# Patient Record
Sex: Female | Born: 1985 | Race: White | Hispanic: No | Marital: Married | Smoking: Never smoker
Health system: Southern US, Community
[De-identification: ages and names within clinical notes are randomized; demographics above are authoritative.]

## PROBLEM LIST (undated history)

## (undated) DIAGNOSIS — F32A Depression, unspecified: Secondary | ICD-10-CM

## (undated) DIAGNOSIS — T8859XA Other complications of anesthesia, initial encounter: Secondary | ICD-10-CM

## (undated) DIAGNOSIS — R112 Nausea with vomiting, unspecified: Secondary | ICD-10-CM

## (undated) DIAGNOSIS — R51 Headache: Secondary | ICD-10-CM

## (undated) DIAGNOSIS — Z9889 Other specified postprocedural states: Secondary | ICD-10-CM

## (undated) DIAGNOSIS — J189 Pneumonia, unspecified organism: Secondary | ICD-10-CM

## (undated) DIAGNOSIS — K219 Gastro-esophageal reflux disease without esophagitis: Secondary | ICD-10-CM

## (undated) DIAGNOSIS — T4145XA Adverse effect of unspecified anesthetic, initial encounter: Secondary | ICD-10-CM

## (undated) DIAGNOSIS — Z87442 Personal history of urinary calculi: Secondary | ICD-10-CM

## (undated) DIAGNOSIS — R519 Headache, unspecified: Secondary | ICD-10-CM

## (undated) DIAGNOSIS — F329 Major depressive disorder, single episode, unspecified: Secondary | ICD-10-CM

## (undated) DIAGNOSIS — M1990 Unspecified osteoarthritis, unspecified site: Secondary | ICD-10-CM

---

## 2005-03-13 HISTORY — PX: OTHER SURGICAL HISTORY: SHX169

## 2005-03-13 HISTORY — PX: APPENDECTOMY: SHX54

## 2010-03-13 HISTORY — PX: LAPAROSCOPIC GASTRIC BANDING: SHX1100

## 2012-03-13 HISTORY — PX: CHOLECYSTECTOMY: SHX55

## 2016-08-04 ENCOUNTER — Ambulatory Visit
Admission: RE | Admit: 2016-08-04 | Discharge: 2016-08-04 | Disposition: A | Discharge status: Home / Self Care | Payer: BLUE CROSS/BLUE SHIELD | Source: Ambulatory Visit | Attending: Surgery | Admitting: Surgery

## 2016-08-04 ENCOUNTER — Other Ambulatory Visit: Payer: Self-pay | Admitting: Surgery

## 2016-08-04 DIAGNOSIS — Z9884 Bariatric surgery status: Secondary | ICD-10-CM

## 2016-09-08 ENCOUNTER — Ambulatory Visit
Admission: RE | Admit: 2016-09-08 | Discharge: 2016-09-08 | Disposition: A | Discharge status: Home / Self Care | Payer: BLUE CROSS/BLUE SHIELD | Source: Ambulatory Visit | Attending: Surgery | Admitting: Surgery

## 2016-09-08 ENCOUNTER — Other Ambulatory Visit: Payer: Self-pay | Admitting: Surgery

## 2016-09-08 DIAGNOSIS — Z9884 Bariatric surgery status: Secondary | ICD-10-CM

## 2016-09-25 ENCOUNTER — Other Ambulatory Visit (HOSPITAL_COMMUNITY): Payer: Self-pay | Admitting: Surgery

## 2016-09-25 DIAGNOSIS — Z9884 Bariatric surgery status: Secondary | ICD-10-CM

## 2016-09-26 ENCOUNTER — Other Ambulatory Visit: Payer: Self-pay | Admitting: Surgery

## 2016-09-26 DIAGNOSIS — Z9884 Bariatric surgery status: Secondary | ICD-10-CM

## 2016-10-02 ENCOUNTER — Other Ambulatory Visit: Payer: Self-pay | Admitting: Surgery

## 2016-10-02 ENCOUNTER — Ambulatory Visit
Admission: RE | Admit: 2016-10-02 | Discharge: 2016-10-02 | Disposition: A | Discharge status: Home / Self Care | Payer: BLUE CROSS/BLUE SHIELD | Source: Ambulatory Visit | Attending: Surgery | Admitting: Surgery

## 2016-10-02 DIAGNOSIS — Z9884 Bariatric surgery status: Secondary | ICD-10-CM

## 2016-10-25 NOTE — Progress Notes (Signed)
Please place orders in EPIC as patient is being scheduled for a pre-op appointment! Thank you! 

## 2016-10-27 ENCOUNTER — Other Ambulatory Visit: Payer: Self-pay | Admitting: Surgery

## 2016-11-06 ENCOUNTER — Encounter: Payer: BLUE CROSS/BLUE SHIELD | Attending: Surgery | Admitting: Skilled Nursing Facility1

## 2016-11-06 DIAGNOSIS — E669 Obesity, unspecified: Secondary | ICD-10-CM | POA: Diagnosis present

## 2016-11-06 DIAGNOSIS — Z713 Dietary counseling and surveillance: Secondary | ICD-10-CM | POA: Insufficient documentation

## 2016-11-08 NOTE — Progress Notes (Signed)
Pre-Operative Nutrition Class:  Appt start time: 9629   End time:  1830.  Patient was seen on 11/06/2016 for Pre-Operative Bariatric Surgery Education at [facility].   Pt arrived too late to the class to get any information from her including height, weight, assessments/screenings. Surgery date: 11/21/2016 Surgery type: Sleeve Start weight at [facility]:  Weight today:   Samples given per MNT protocol. Patient educated on appropriate usage: Bariatric Advantage Multivitamin Lot # [id number] Exp: 06/19  Bariatric Advantage Calcium  Lot # [id number] Exp: dec-19-2018  PremierProtein Shake Lot # [id number] Exp: 27/nov/2018 The following the learning objectives were met by the patient during this course:  Identify Pre-Op Dietary Goals and will begin 2 weeks pre-operatively  Identify appropriate sources of fluids and proteins   State protein recommendations and appropriate sources pre and post-operatively  Identify Post-Operative Dietary Goals and will follow for 2 weeks post-operatively  Identify appropriate multivitamin and calcium sources  Describe the need for physical activity post-operatively and will follow MD recommendations  State when to call healthcare provider regarding medication questions or post-operative complications  Handouts given during class include:  Pre-Op Bariatric Surgery Diet Handout  Protein Shake Handout  Post-Op Bariatric Surgery Nutrition Handout  BELT Program Information Flyer  Support Group Information Flyer  [facility] Bariatric Supplements Price List  Follow-Up Plan: Patient will follow-up at [facility] 2 weeks post operatively for diet advancement per MD.

## 2016-11-10 NOTE — Patient Instructions (Addendum)
Jamie Cooper  11/10/2016   Your procedure is scheduled on: Tuesday 11-21-16  Report to [facility]  Entrance [place]  elevators to 3rd floor to  Short Stay Center at 515 AM.   Call this number if you have problems the morning of surgery [phone]  Remember: ONLY 1 PERSON MAY GO WITH YOU TO SHORT STAY TO GET  READY MORNING OF YOUR SURGERY.  Do not eat food or drink liquids :After Midnight.     Take these medicines the morning of surgery with A SIP OF WATER:none              You may not have any metal on your body including hair pins and              piercings  Do not wear jewelry, make-up, lotions, powders or perfumes, deodorant             Do not wear nail polish.  Do not shave  48 hours prior to surgery.              Men may shave face and neck.   Do not bring valuables to the hospital. [name] IS NOT             RESPONSIBLE   FOR VALUABLES.  Contacts, dentures or bridgework may not be worn into surgery.  Leave suitcase in the car. After surgery it may be brought to your room.                 Please read over the following fact sheets you were given: _____________________________________________________________________             [name] - Preparing for Surgery Before surgery, you can play an important role.  Because skin is not sterile, your skin needs to be as free of germs as possible.  You can reduce the number of germs on your skin by washing with CHG (chlorahexidine gluconate) soap before surgery.  CHG is an antiseptic cleaner which kills germs and bonds with the skin to continue killing germs even after washing. Please DO NOT use if you have an allergy to CHG or antibacterial soaps.  If your skin becomes reddened/irritated stop using the CHG and inform your nurse when you arrive at Short Stay. Do not shave (including legs and underarms) for at least 48 hours prior to the first CHG shower.  You may shave your face/neck. Please follow  these instructions carefully:  1.  Shower with CHG Soap the night before surgery and the  morning of Surgery.  2.  If you choose to wash your hair, wash your hair first as usual with your  normal  shampoo.  3.  After you shampoo, rinse your hair and body thoroughly to remove the  shampoo.                           4.  Use CHG as you would any other liquid soap.  You can apply chg directly  to the skin and wash                       Gently with a scrungie or clean washcloth.  5.  Apply the CHG Soap to your body ONLY FROM THE NECK DOWN.   Do not use on face/ open  Wound or open sores. Avoid contact with eyes, ears mouth and genitals (private parts).                       Wash face,  Genitals (private parts) with your normal soap.             6.  Wash thoroughly, paying special attention to the area where your surgery  will be performed.  7.  Thoroughly rinse your body with warm water from the neck down.  8.  DO NOT shower/wash with your normal soap after using and rinsing off  the CHG Soap.                9.  Pat yourself dry with a clean towel.            10.  Wear clean pajamas.            11.  Place clean sheets on your bed the night of your first shower and do not  sleep with pets. Day of Surgery : Do not apply any lotions/deodorants the morning of surgery.  Please wear clean clothes to the hospital/surgery center.  FAILURE TO FOLLOW THESE INSTRUCTIONS MAY RESULT IN THE CANCELLATION OF YOUR SURGERY PATIENT SIGNATURE_________________________________  NURSE SIGNATURE__________________________________  ________________________________________________________________________

## 2016-11-14 ENCOUNTER — Encounter (HOSPITAL_COMMUNITY)
Admission: RE | Admit: 2016-11-14 | Discharge: 2016-11-14 | Disposition: A | Discharge status: Home / Self Care | Payer: BLUE CROSS/BLUE SHIELD | Source: Ambulatory Visit | Attending: Surgery | Admitting: Surgery

## 2016-11-14 ENCOUNTER — Encounter (HOSPITAL_COMMUNITY): Payer: Self-pay

## 2016-11-14 DIAGNOSIS — Z9884 Bariatric surgery status: Secondary | ICD-10-CM | POA: Diagnosis not present

## 2016-11-14 DIAGNOSIS — Z01818 Encounter for other preprocedural examination: Secondary | ICD-10-CM | POA: Diagnosis present

## 2016-11-14 HISTORY — DX: Headache: R51

## 2016-11-14 HISTORY — DX: Major depressive disorder, single episode, unspecified: F32.9

## 2016-11-14 HISTORY — DX: Other specified postprocedural states: Z98.890

## 2016-11-14 HISTORY — DX: Personal history of urinary calculi: Z87.442

## 2016-11-14 HISTORY — DX: Headache, unspecified: R51.9

## 2016-11-14 HISTORY — DX: Other specified postprocedural states: R11.2

## 2016-11-14 HISTORY — DX: Gastro-esophageal reflux disease without esophagitis: K21.9

## 2016-11-14 HISTORY — DX: Other complications of anesthesia, initial encounter: T88.59XA

## 2016-11-14 HISTORY — DX: Depression, unspecified: F32.A

## 2016-11-14 HISTORY — DX: Adverse effect of unspecified anesthetic, initial encounter: T41.45XA

## 2016-11-14 HISTORY — DX: Pneumonia, unspecified organism: J18.9

## 2016-11-14 HISTORY — DX: Unspecified osteoarthritis, unspecified site: M19.90

## 2016-11-14 LAB — CBC WITH DIFFERENTIAL/PLATELET
BASOS ABS: 0 10*3/uL (ref 0.0–0.1)
BASOS PCT: 1 %
Eosinophils Absolute: 0.2 10*3/uL (ref 0.0–0.7)
Eosinophils Relative: 3 %
HEMATOCRIT: 37.8 % (ref 36.0–46.0)
HEMOGLOBIN: 12 g/dL (ref 12.0–15.0)
Lymphocytes Relative: 34 %
Lymphs Abs: 2.1 10*3/uL (ref 0.7–4.0)
MCH: 24.6 pg — ABNORMAL LOW (ref 26.0–34.0)
MCHC: 31.7 g/dL (ref 30.0–36.0)
MCV: 77.5 fL — ABNORMAL LOW (ref 78.0–100.0)
MONOS PCT: 6 %
Monocytes Absolute: 0.4 10*3/uL (ref 0.1–1.0)
NEUTROS ABS: 3.6 10*3/uL (ref 1.7–7.7)
NEUTROS PCT: 56 %
Platelets: 237 10*3/uL (ref 150–400)
RBC: 4.88 MIL/uL (ref 3.87–5.11)
RDW: 15.4 % (ref 11.5–15.5)
WBC: 6.4 10*3/uL (ref 4.0–10.5)

## 2016-11-14 LAB — COMPREHENSIVE METABOLIC PANEL
ALBUMIN: 3.9 g/dL (ref 3.5–5.0)
ALT: 13 U/L — ABNORMAL LOW (ref 14–54)
AST: 16 U/L (ref 15–41)
Alkaline Phosphatase: 49 U/L (ref 38–126)
Anion gap: 7 (ref 5–15)
BILIRUBIN TOTAL: 0.4 mg/dL (ref 0.3–1.2)
BUN: 12 mg/dL (ref 6–20)
CALCIUM: 8.9 mg/dL (ref 8.9–10.3)
CO2: 20 mmol/L — ABNORMAL LOW (ref 22–32)
Chloride: 111 mmol/L (ref 101–111)
Creatinine, Ser: 0.76 mg/dL (ref 0.44–1.00)
GFR calc Af Amer: >60 mL/min (ref >60)
GFR calc non Af Amer: >60 mL/min (ref >60)
GLUCOSE: 85 mg/dL (ref 65–99)
Potassium: 4.1 mmol/L (ref 3.5–5.1)
Sodium: 138 mmol/L (ref 135–145)
TOTAL PROTEIN: 6.9 g/dL (ref 6.5–8.1)

## 2016-11-20 NOTE — H&P (Signed)
Jamie Cooper  Location[facility] Patient #: [id number] DOB: 1985-12-19 Married / Language: English / Race: White Female  History of Present Illness   The patient is a 31 year old female who presents with a complaint of lap band management.  The PCP is Dr. Gilda CreaseMahendra Narendran [facility]  She comes with her daughter, Jamie Cooper.  She has a slipped lap band. We are plannig a conversion to a lap sleeve on 11/21/2016. She had good weight loss prior to having trouble with a lap band, and I expect to do well from the sleeve gastrectomy. Her husband had a gastric bypass and has tuned into nutrition and taking vitamins. I think that she is a candidate for conversion to a sleeve gastrectomy. I discussed with the patient the indications and risks of bariatric surgery. The potential risks of surgery include, but are not limited to, bleeding, infection, leak from the bowel, DVT and PE, open surgery, long term nutrition consequences, and death. We also talked about this may be a 2-stage procedure. If the lap band is [name] the stomach up to much I will not be reduced sleeve gastrectomy same time. The patient understands the importance of compliance and long term follow-up with our group after surgery.  She brought lab work with her that we have copied. The lab work looks okay. She had several things to complete: 1) Permit 2) EMMI video 3) Nutrition consult  History of lap band problems: She had a APS lap band by Dr. Reubin [place]Gary Robinson [place] in 02/01/2011. She said that her initial weight was >300 pounds and she lost to under 200 pounds. Then she had GB surgery by Dr. Roxan Hockeyobinson in 2014 and had trouble adjusting her lap band. She would develop nause, because the lap band would be too tight and then it would be too loose. She thinks that her last fill was in 2015. But she is unsure how much fluid is in the lap band. (The last note I have from  Dr. Roxan Hockeyobinson is dated 10/13/2014 - he stated he put in 5.6 cc in the lap band - this actually worked out to about what I pulled out) Because of conitnued trouble, she was referred to Dr. Wyona Almasyan Haider in [place]. He obtained an UGI on 07/30/2015. She never heard back from his office, but her recommended revising her to a sleeve gastrectomy. She does not want a sleeve gastrectomy and was hoping to work wiht her lap band. She has had good success with the lap band and had lost over 100 pounds. [facility] show on the UGI that she has an anterior slip of her lap band. This is kind of mentioned in the body of report - but they do not spell it out.  Past Medical History: 1. Lap band - APS Dr. Reubin MilanGary Robinson - [place] - 02/01/2011 1. Lap appendectomy and cyst on intestines - 2007 - Dr. Roxan Hockeyobinson 2. Lap chole - 2014 - Dr. Roxan Hockeyobinson 3. Back issues She has had ablations, epidurals, and had at least 2 nerve blocks of her back She has pain down her left leg and numbess in her left foot. She just had a nerve block last week. She is seeing Dr. Sharlee BlewLargure, anesthesiology back ground, [name] is his PA. She has seen Dr. 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 She is on Gralise (gabenpentin) Her back is still an issue. We talked about  NSAID use adn its risks after surgery. 4. History of kidney stones  Social History: Married. His name is Jamie Cooper. Her husband works at [employer]. He had a gastric bypass in 2011. 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   Allergies (Tanisha A. Manson Passey, RMA; 10/27/2016 11:31 AM) Lyrica CR *Psychotherapeutic and Neurological Agents - Misc.**  Swelling. Allergies Reconciled   Medication History (Tanisha A. Manson Passey, RMA;  10/27/2016 11:31 AM) Tanya Nones (  Tablet, Oral) Active. TraMADol HCl (  Tablet, Oral) Active. Amitriptyline HCl (  Tablet, Oral) Active. Topiramate (  Tablet, Oral) Active. Medications Reconciled  Vitals (Tanisha A. Brown RMA; 10/27/2016 11:30 AM) 10/27/2016 11:30 AM Weight: 240.2 lb Height: 65in Body Surface Area: 2.14 m Body Mass Index: 39.97 kg/m  Temp.: 97.59F  Pulse: 92 (Regular)  BP: 124/78 (Sitting, Left Arm, Standard)   Physical Exam  General: Obese WF alert and generally healthy appearing. HEENT: Normal. Pupils equal.  Neck: Supple. No mass. No thyroid mass.  Lymph Nodes: No supraclavicular or cervical nodes.  Lungs: Clear to auscultation and symmetric breath sounds. Heart: RRR. No murmur or rub.  Abdomen: Soft. No mass. No tenderness. No hernia. Normal bowel sounds.  The port for her lap band is in the LUQ. I talked to her about using the lap band port for the stomach extraction. She is about 1/2 apple and 1/2 pear.  Extremities: Good strength and ROM in upper and lower extremities.  Neurologic: Grossly intact to motor and sensory function. Psychiatric: Has normal mood and affect. Behavior is normal.   Assessment & Plan  1.  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 Melina Copa, [place]  Impression: Slipped lap band - plan one or two stage conversion  Surgery scheduled for 11/21/2016   Plan:   1) Consent for sleeve gastrectomy   2) EMMI video   3) Nutrition consult for pre op and post op plan   4) Meds Started OxyCODONE HCl /5ML, 5-10 Milliliter every four hours, as needed, 120 Milliliter, 10/27/2016, No Refill. Started Ondansetron HCl , 1 (one) Tablet every eight hours, as needed, #10, 10/27/2016, No Refill. Started Pantoprazole Sodium , 1 (one) Tablet daily, #30, 10/27/2016, No Refill.   5) Lap band removal and sleeve gastrectomy scheduled for  11/21/2016  2. Morbid obesity  BMI - 39.9 3.  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 She just had a nerve block last week.  She is seeing Dr. Sharlee Blew, anesthesiology back ground, Orpah Clinton is his PA.  She has seen Dr. 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 She is on Gralise (gabenpentin) Her back is still an issue. We talked about NSAID use adn its risks after surgery. 4. History of kidney stones   Ovidio Kin, MD, [facility] Surgery Pager: [phone] Office phone:  [phone]

## 2016-11-21 ENCOUNTER — Encounter (HOSPITAL_COMMUNITY)
Admission: RE | Disposition: A | Discharge status: Home / Self Care | Payer: Self-pay | Source: Ambulatory Visit | Attending: Surgery

## 2016-11-21 ENCOUNTER — Inpatient Hospital Stay (HOSPITAL_COMMUNITY): Payer: BLUE CROSS/BLUE SHIELD

## 2016-11-21 ENCOUNTER — Encounter (HOSPITAL_COMMUNITY): Payer: Self-pay | Admitting: *Deleted

## 2016-11-21 ENCOUNTER — Observation Stay (HOSPITAL_COMMUNITY)
Admission: RE | Admit: 2016-11-21 | Discharge: 2016-11-22 | Disposition: A | Discharge status: Home / Self Care | Payer: BLUE CROSS/BLUE SHIELD | Source: Ambulatory Visit | Attending: Surgery | Admitting: Surgery

## 2016-11-21 DIAGNOSIS — Z6839 Body mass index (BMI) 39.0-39.9, adult: Secondary | ICD-10-CM | POA: Diagnosis not present

## 2016-11-21 DIAGNOSIS — K9509 Other complications of gastric band procedure: Principal | ICD-10-CM | POA: Insufficient documentation

## 2016-11-21 DIAGNOSIS — Z87442 Personal history of urinary calculi: Secondary | ICD-10-CM | POA: Insufficient documentation

## 2016-11-21 DIAGNOSIS — Z79899 Other long term (current) drug therapy: Secondary | ICD-10-CM | POA: Insufficient documentation

## 2016-11-21 DIAGNOSIS — K660 Peritoneal adhesions (postprocedural) (postinfection): Secondary | ICD-10-CM | POA: Insufficient documentation

## 2016-11-21 DIAGNOSIS — G8929 Other chronic pain: Secondary | ICD-10-CM | POA: Diagnosis not present

## 2016-11-21 DIAGNOSIS — Z9884 Bariatric surgery status: Secondary | ICD-10-CM

## 2016-11-21 DIAGNOSIS — M545 Low back pain: Secondary | ICD-10-CM | POA: Insufficient documentation

## 2016-11-21 DIAGNOSIS — K219 Gastro-esophageal reflux disease without esophagitis: Secondary | ICD-10-CM | POA: Diagnosis not present

## 2016-11-21 HISTORY — PX: LAPAROSCOPIC GASTRIC BAND REMOVAL WITH LAPAROSCOPIC GASTRIC SLEEVE RESECTION: SHX6498

## 2016-11-21 LAB — PREGNANCY, URINE: Preg Test, Ur: NEGATIVE

## 2016-11-21 LAB — SURGICAL PCR SCREEN
MRSA, PCR: NEGATIVE
STAPHYLOCOCCUS AUREUS: NEGATIVE

## 2016-11-21 SURGERY — LAPAROSCOPIC GASTRIC BAND REMOVAL WITH LAPAROSCOPIC GASTRIC SLEEVE RESECTION
Anesthesia: General | Site: Abdomen

## 2016-11-21 MED ORDER — DEXAMETHASONE SODIUM PHOSPHATE 10 MG/ML IJ SOLN
INTRAMUSCULAR | Status: DC | PRN
  Administered 2016-11-21: 6 mg via INTRAVENOUS

## 2016-11-21 MED ORDER — FENTANYL CITRATE (PF) 250 MCG/5ML IJ SOLN
INTRAMUSCULAR | Status: AC
  Filled 2016-11-21: qty 5

## 2016-11-21 MED ORDER — BUPIVACAINE-EPINEPHRINE (PF) 0.25% -1:200000 IJ SOLN
INTRAMUSCULAR | Status: AC
  Filled 2016-11-21: qty 30

## 2016-11-21 MED ORDER — KETAMINE HCL 10 MG/ML IJ SOLN
INTRAMUSCULAR | Status: DC | PRN
  Administered 2016-11-21: 30 mg via INTRAVENOUS

## 2016-11-21 MED ORDER — ONDANSETRON HCL 4 MG/2ML IJ SOLN
INTRAMUSCULAR | Status: DC | PRN
  Administered 2016-11-21: 4 mg via INTRAVENOUS

## 2016-11-21 MED ORDER — LIDOCAINE 2% (20 MG/ML) 5 ML SYRINGE
INTRAMUSCULAR | Status: DC | PRN
Start: 2016-11-21 — End: 2016-11-21
  Administered 2016-11-21: 1.5 mg/kg/h via INTRAVENOUS

## 2016-11-21 MED ORDER — HYDROMORPHONE HCL-NACL 0.5-0.9 MG/ML-% IV SOSY
PREFILLED_SYRINGE | INTRAVENOUS | Status: AC
  Administered 2016-11-21: 11:00:00
  Filled 2016-11-21: qty 1

## 2016-11-21 MED ORDER — TOPIRAMATE 25 MG PO TABS
50.0000 mg | ORAL_TABLET | Freq: Every day | ORAL | Status: DC
  Administered 2016-11-21: 50 mg via ORAL
  Filled 2016-11-21: qty 2

## 2016-11-21 MED ORDER — ONDANSETRON HCL 4 MG/2ML IJ SOLN
4.0000 mg | INTRAMUSCULAR | Status: DC | PRN
Start: 2016-11-21 — End: 2016-11-22

## 2016-11-21 MED ORDER — BUPIVACAINE HCL 0.25 % IJ SOLN
INTRAMUSCULAR | Status: DC | PRN
  Administered 2016-11-21: 30 mL

## 2016-11-21 MED ORDER — SUGAMMADEX SODIUM 500 MG/5ML IV SOLN
INTRAVENOUS | Status: DC | PRN
  Administered 2016-11-21: 250 mg via INTRAVENOUS

## 2016-11-21 MED ORDER — PANTOPRAZOLE SODIUM 40 MG IV SOLR
40.0000 mg | Freq: Every day | INTRAVENOUS | Status: DC
  Administered 2016-11-21: 40 mg via INTRAVENOUS
  Filled 2016-11-21: qty 40

## 2016-11-21 MED ORDER — LIDOCAINE 2% (20 MG/ML) 5 ML SYRINGE
INTRAMUSCULAR | Status: DC | PRN
  Administered 2016-11-21: 100 mg via INTRAVENOUS

## 2016-11-21 MED ORDER — HEPARIN SODIUM (PORCINE) 5000 UNIT/ML IJ SOLN
5000.0000 [IU] | INTRAMUSCULAR | Status: AC
  Administered 2016-11-21: 5000 [IU] via SUBCUTANEOUS
  Filled 2016-11-21: qty 1

## 2016-11-21 MED ORDER — CHLORHEXIDINE GLUCONATE 4 % EX LIQD: 60.0000 mL | Freq: Once | CUTANEOUS | Status: DC

## 2016-11-21 MED ORDER — HYDROMORPHONE HCL-NACL 0.5-0.9 MG/ML-% IV SOSY
PREFILLED_SYRINGE | INTRAVENOUS | Status: AC
  Filled 2016-11-21: qty 1

## 2016-11-21 MED ORDER — TISSEEL VH 10 ML EX KIT
PACK | CUTANEOUS | Status: AC
  Filled 2016-11-21: qty 2

## 2016-11-21 MED ORDER — CEFOTETAN DISODIUM-DEXTROSE 2-2.08 GM-% IV SOLR
2.0000 g | INTRAVENOUS | Status: AC
  Administered 2016-11-21: 2 g via INTRAVENOUS

## 2016-11-21 MED ORDER — FENTANYL CITRATE (PF) 250 MCG/5ML IJ SOLN
INTRAMUSCULAR | Status: DC | PRN
  Administered 2016-11-21 (×5): 50 ug via INTRAVENOUS

## 2016-11-21 MED ORDER — ACETAMINOPHEN 325 MG PO TABS: 650.0000 mg | ORAL_TABLET | ORAL | Status: DC | PRN

## 2016-11-21 MED ORDER — ROCURONIUM BROMIDE 50 MG/5ML IV SOSY
PREFILLED_SYRINGE | INTRAVENOUS | Status: AC
  Filled 2016-11-21: qty 5

## 2016-11-21 MED ORDER — SUCCINYLCHOLINE CHLORIDE 200 MG/10ML IV SOSY
PREFILLED_SYRINGE | INTRAVENOUS | Status: AC
  Filled 2016-11-21: qty 10

## 2016-11-21 MED ORDER — KETOROLAC TROMETHAMINE 30 MG/ML IJ SOLN: 30.0000 mg | Freq: Once | INTRAMUSCULAR | Status: DC | PRN

## 2016-11-21 MED ORDER — LIDOCAINE 2% (20 MG/ML) 5 ML SYRINGE
INTRAMUSCULAR | Status: AC
  Filled 2016-11-21: qty 10

## 2016-11-21 MED ORDER — KCL IN DEXTROSE-NACL 20-5-0.45 MEQ/L-%-% IV SOLN
INTRAVENOUS | Status: DC
  Administered 2016-11-21 – 2016-11-22 (×2): via INTRAVENOUS
  Filled 2016-11-21 (×2): qty 1000

## 2016-11-21 MED ORDER — MIDAZOLAM HCL 5 MG/5ML IJ SOLN
INTRAMUSCULAR | Status: DC | PRN
  Administered 2016-11-21: 2 mg via INTRAVENOUS

## 2016-11-21 MED ORDER — CITALOPRAM HYDROBROMIDE 20 MG PO TABS
20.0000 mg | ORAL_TABLET | Freq: Every day | ORAL | Status: DC
  Administered 2016-11-21: 20 mg via ORAL
  Filled 2016-11-21: qty 1

## 2016-11-21 MED ORDER — BUPIVACAINE LIPOSOME 1.3 % IJ SUSP
INTRAMUSCULAR | Status: DC | PRN
  Administered 2016-11-21: 20 mL

## 2016-11-21 MED ORDER — MORPHINE SULFATE (PF) 2 MG/ML IV SOLN
1.0000 mg | INTRAVENOUS | Status: DC | PRN
  Administered 2016-11-21 (×2): 2 mg via INTRAVENOUS
  Filled 2016-11-21 (×2): qty 1

## 2016-11-21 MED ORDER — TRAMADOL HCL 50 MG PO TABS
50.0000 mg | ORAL_TABLET | Freq: Two times a day (BID) | ORAL | Status: DC | PRN
  Administered 2016-11-22: 50 mg via ORAL
  Filled 2016-11-21: qty 1

## 2016-11-21 MED ORDER — 0.9 % SODIUM CHLORIDE (POUR BTL) OPTIME
TOPICAL | Status: DC | PRN
  Administered 2016-11-21: 1000 mL

## 2016-11-21 MED ORDER — PROPOFOL 10 MG/ML IV BOLUS
INTRAVENOUS | Status: AC
  Filled 2016-11-21: qty 20

## 2016-11-21 MED ORDER — HEPARIN SODIUM (PORCINE) 5000 UNIT/ML IJ SOLN
5000.0000 [IU] | Freq: Three times a day (TID) | INTRAMUSCULAR | Status: DC
  Administered 2016-11-21 – 2016-11-22 (×2): 5000 [IU] via SUBCUTANEOUS
  Filled 2016-11-21 (×2): qty 1

## 2016-11-21 MED ORDER — KETAMINE HCL 10 MG/ML IJ SOLN
INTRAMUSCULAR | Status: AC
  Filled 2016-11-21: qty 1

## 2016-11-21 MED ORDER — BUPIVACAINE LIPOSOME 1.3 % IJ SUSP
20.0000 mL | Freq: Once | INTRAMUSCULAR | Status: DC
  Administered 2016-11-21: 266 mg
  Filled 2016-11-21: qty 20

## 2016-11-21 MED ORDER — ONDANSETRON HCL 4 MG/2ML IJ SOLN
INTRAMUSCULAR | Status: AC
  Filled 2016-11-21: qty 2

## 2016-11-21 MED ORDER — SUCCINYLCHOLINE CHLORIDE 200 MG/10ML IV SOSY
PREFILLED_SYRINGE | INTRAVENOUS | Status: DC | PRN
  Administered 2016-11-21: 120 mg via INTRAVENOUS

## 2016-11-21 MED ORDER — CEFOTETAN DISODIUM-DEXTROSE 2-2.08 GM-% IV SOLR
INTRAVENOUS | Status: AC
  Filled 2016-11-21: qty 50

## 2016-11-21 MED ORDER — HYDROMORPHONE HCL-NACL 0.5-0.9 MG/ML-% IV SOSY
0.2500 mg | PREFILLED_SYRINGE | INTRAVENOUS | Status: DC | PRN
  Administered 2016-11-21: 0.5 mg via INTRAVENOUS

## 2016-11-21 MED ORDER — PHENYLEPHRINE HCL 10 MG/ML IJ SOLN
INTRAMUSCULAR | Status: DC | PRN
  Administered 2016-11-21: 80 ug via INTRAVENOUS

## 2016-11-21 MED ORDER — LACTATED RINGERS IV SOLN
INTRAVENOUS | Status: DC | PRN
  Administered 2016-11-21 (×2): via INTRAVENOUS

## 2016-11-21 MED ORDER — SCOPOLAMINE 1 MG/3DAYS TD PT72
MEDICATED_PATCH | TRANSDERMAL | Status: AC
  Filled 2016-11-21: qty 1

## 2016-11-21 MED ORDER — ACETAMINOPHEN 500 MG PO TABS
1000.0000 mg | ORAL_TABLET | ORAL | Status: AC
  Administered 2016-11-21: 1000 mg via ORAL
  Filled 2016-11-21: qty 2

## 2016-11-21 MED ORDER — TISSEEL VH 10 ML EX KIT
PACK | Freq: Once | CUTANEOUS | Status: DC
  Administered 2016-11-21: 07:00:00 via TOPICAL

## 2016-11-21 MED ORDER — LACTATED RINGERS IR SOLN
Status: DC | PRN
  Administered 2016-11-21: 2000 mL

## 2016-11-21 MED ORDER — PROMETHAZINE HCL 25 MG/ML IJ SOLN: 6.2500 mg | INTRAMUSCULAR | Status: DC | PRN

## 2016-11-21 MED ORDER — SCOPOLAMINE 1 MG/3DAYS TD PT72
MEDICATED_PATCH | TRANSDERMAL | Status: DC | PRN
  Administered 2016-11-21: 1 via TRANSDERMAL

## 2016-11-21 MED ORDER — METHOCARBAMOL 500 MG PO TABS
500.0000 mg | ORAL_TABLET | Freq: Two times a day (BID) | ORAL | Status: DC
  Administered 2016-11-21 – 2016-11-22 (×2): 500 mg via ORAL
  Filled 2016-11-21 (×3): qty 1

## 2016-11-21 MED ORDER — MIDAZOLAM HCL 2 MG/2ML IJ SOLN
INTRAMUSCULAR | Status: AC
  Filled 2016-11-21: qty 2

## 2016-11-21 MED ORDER — DEXAMETHASONE SODIUM PHOSPHATE 10 MG/ML IJ SOLN
INTRAMUSCULAR | Status: AC
  Filled 2016-11-21: qty 1

## 2016-11-21 MED ORDER — APREPITANT 40 MG PO CAPS
40.0000 mg | ORAL_CAPSULE | ORAL | Status: AC
  Administered 2016-11-21: 40 mg via ORAL
  Filled 2016-11-21: qty 1

## 2016-11-21 MED ORDER — PROPOFOL 10 MG/ML IV BOLUS
INTRAVENOUS | Status: DC | PRN
  Administered 2016-11-21: 200 mg via INTRAVENOUS

## 2016-11-21 MED ORDER — LIDOCAINE 2% (20 MG/ML) 5 ML SYRINGE
INTRAMUSCULAR | Status: AC
  Filled 2016-11-21: qty 5

## 2016-11-21 MED ORDER — SUGAMMADEX SODIUM 500 MG/5ML IV SOLN
INTRAVENOUS | Status: AC
  Filled 2016-11-21: qty 5

## 2016-11-21 MED ORDER — MEPERIDINE HCL 50 MG/ML IJ SOLN: 6.2500 mg | INTRAMUSCULAR | Status: DC | PRN

## 2016-11-21 MED ORDER — ROCURONIUM BROMIDE 50 MG/5ML IV SOSY
PREFILLED_SYRINGE | INTRAVENOUS | Status: DC | PRN
  Administered 2016-11-21: 50 mg via INTRAVENOUS
  Administered 2016-11-21: 10 mg via INTRAVENOUS
  Administered 2016-11-21: 20 mg via INTRAVENOUS

## 2016-11-21 SURGICAL SUPPLY — 61 items
APPLICATOR COTTON TIP 6IN STRL (MISCELLANEOUS) IMPLANT
APPLIER CLIP ROT 10 11.4 M/L (STAPLE)
APPLIER CLIP ROT 13.4 12 LRG (CLIP)
BLADE SURG 15 STRL LF DISP TIS (BLADE) ×1 IMPLANT
BLADE SURG 15 STRL SS (BLADE) ×2
CABLE HIGH FREQUENCY MONO STRZ (ELECTRODE) IMPLANT
CHLORAPREP W/TINT 26ML (MISCELLANEOUS) ×3 IMPLANT
CLIP APPLIE ROT 10 11.4 M/L (STAPLE) IMPLANT
CLIP APPLIE ROT 13.4 12 LRG (CLIP) IMPLANT
DERMABOND ADVANCED (GAUZE/BANDAGES/DRESSINGS) ×2
DERMABOND ADVANCED .7 DNX12 (GAUZE/BANDAGES/DRESSINGS) ×1 IMPLANT
DEVICE PMI PUNCTURE CLOSURE (MISCELLANEOUS) ×3 IMPLANT
DEVICE SUT QUICK LOAD TK 5 (STAPLE) IMPLANT
DEVICE SUT TI-KNOT TK 5X26 (MISCELLANEOUS) IMPLANT
DEVICE SUTURE ENDOST 10MM (ENDOMECHANICALS) IMPLANT
DEVICE TI KNOT TK5 (MISCELLANEOUS)
DEVICE TROCAR PUNCTURE CLOSURE (ENDOMECHANICALS) IMPLANT
DISSECTOR BLUNT TIP ENDO 5MM (MISCELLANEOUS) IMPLANT
DRAPE UTILITY XL STRL (DRAPES) ×6 IMPLANT
ELECT REM PT RETURN 15FT ADLT (MISCELLANEOUS) ×3 IMPLANT
GAUZE SPONGE 4X4 12PLY STRL (GAUZE/BANDAGES/DRESSINGS) IMPLANT
GLOVE SURG SIGNA 7.5 PF LTX (GLOVE) ×3 IMPLANT
GOWN STRL REUS W/TWL XL LVL3 (GOWN DISPOSABLE) ×9 IMPLANT
HOVERMATT SINGLE USE (MISCELLANEOUS) ×3 IMPLANT
KIT BASIN OR (CUSTOM PROCEDURE TRAY) ×3 IMPLANT
MARKER SKIN DUAL TIP RULER LAB (MISCELLANEOUS) ×3 IMPLANT
NEEDLE SPNL 22GX3.5 QUINCKE BK (NEEDLE) ×3 IMPLANT
PACK UNIVERSAL I (CUSTOM PROCEDURE TRAY) ×3 IMPLANT
PENCIL BUTTON HOLSTER BLD 10FT (ELECTRODE) IMPLANT
QUICK LOAD TK 5 (STAPLE)
RELOAD STAPLER BLUE 60MM (STAPLE) IMPLANT
RELOAD STAPLER GOLD 60MM (STAPLE) IMPLANT
RELOAD STAPLER GREEN 60MM (STAPLE) IMPLANT
SCISSORS LAP 5X35 DISP (ENDOMECHANICALS) ×3 IMPLANT
SEALANT SURGICAL APPL DUAL CAN (MISCELLANEOUS) ×3 IMPLANT
SET IRRIG TUBING LAPAROSCOPIC (IRRIGATION / IRRIGATOR) ×3 IMPLANT
SHEARS HARMONIC ACE PLUS 45CM (MISCELLANEOUS) ×3 IMPLANT
SLEEVE ADV FIXATION 5X100MM (TROCAR) ×6 IMPLANT
SLEEVE GASTRECTOMY 36FR VISIGI (MISCELLANEOUS) ×3 IMPLANT
SOLUTION ANTI FOG 6CC (MISCELLANEOUS) ×3 IMPLANT
SPONGE LAP 18X18 X RAY DECT (DISPOSABLE) ×3 IMPLANT
STAPLER ECHELON LONG 60 440 (INSTRUMENTS) ×3 IMPLANT
STAPLER RELOAD BLUE 60MM (STAPLE)
STAPLER RELOAD GOLD 60MM (STAPLE)
STAPLER RELOAD GREEN 60MM (STAPLE)
SUT MNCRL AB 4-0 PS2 18 (SUTURE) ×6 IMPLANT
SUT SURGIDAC NAB ES-9 0 48 120 (SUTURE) IMPLANT
SUT VIC AB 3-0 SH 18 (SUTURE) ×3 IMPLANT
SUT VICRYL 0 TIES 12 18 (SUTURE) IMPLANT
SYR 10ML ECCENTRIC (SYRINGE) ×3 IMPLANT
SYR CONTROL 10ML LL (SYRINGE) ×3 IMPLANT
TOWEL OR 17X26 10 PK STRL BLUE (TOWEL DISPOSABLE) ×3 IMPLANT
TOWEL OR NON WOVEN STRL DISP B (DISPOSABLE) ×3 IMPLANT
TROCAR ADV FIXATION 12X100MM (TROCAR) ×3 IMPLANT
TROCAR ADV FIXATION 5X100MM (TROCAR) ×3 IMPLANT
TROCAR BLADELESS 15MM (ENDOMECHANICALS) ×3 IMPLANT
TROCAR BLADELESS OPT 5 100 (ENDOMECHANICALS) ×3 IMPLANT
TUBING CONNECTING 10 (TUBING) ×2 IMPLANT
TUBING CONNECTING 10' (TUBING) ×1
TUBING ENDO SMARTCAP PENTAX (MISCELLANEOUS) ×3 IMPLANT
TUBING INSUF HEATED (TUBING) ×3 IMPLANT

## 2016-11-21 NOTE — Anesthesia Procedure Notes (Signed)
Procedure Name: Intubation Date/Time: 11/21/2016 7:24 AM Performed by: Maxwell Caul Pre-anesthesia Checklist: Patient identified, Emergency Drugs available, Suction available and Patient being monitored Patient Re-evaluated:Patient Re-evaluated prior to induction Oxygen Delivery Method: Circle system utilized Preoxygenation: Pre-oxygenation with 100% oxygen Induction Type: IV induction Ventilation: Mask ventilation without difficulty Laryngoscope Size: Mac and 4 Grade View: Grade I Tube type: Oral Tube size: 7.5 mm Airway Equipment and Method: Stylet Placement Confirmation: ETT inserted through vocal cords under direct vision,  positive ETCO2 and breath sounds checked- equal and bilateral Secured at: 21 cm Tube secured with: Tape Dental Injury: Teeth and Oropharynx as per pre-operative assessment  Comments: DL X1 via EMT Daniel-ETT in esophagus. DL X1 via CRNA with Grade 1 view. (New) ETT 7.5 gently placed with BBS=, ETCO2+

## 2016-11-21 NOTE — Transfer of Care (Signed)
Immediate Anesthesia Transfer of Care Note  Patient: Jamie Cooper  Procedure(s) Performed: Procedure(s): LAPAROSCOPIC GASTRIC BAND REMOVAL WITH UPPER ENDO AND LYSIS OF ADHESION (N/A)  Patient Location: PACU  Anesthesia Type:General  Level of Consciousness:  sedated, patient cooperative and responds to stimulation  Airway & Oxygen Therapy:Patient Spontanous Breathing and Patient connected to face mask oxgen  Post-op Assessment:  Report given to PACU RN and Post -op Vital signs reviewed and stable  Post vital signs:  Reviewed and stable  Last Vitals:  Vitals:   11/21/16 0531  BP: 136/81  Pulse: 75  Resp: 16  Temp: 37 C  SpO2: 100%    Complications: No apparent anesthesia complications

## 2016-11-21 NOTE — Interval H&P Note (Signed)
History and Physical Interval Note:  11/21/2016 7:06 AM  Jamie Cooper  has presented today for surgery, with the diagnosis of MORBID OBESITY  The various methods of treatment have been discussed with the patient and family.  Her husband, [name], is here.  After consideration of risks, benefits and other options for treatment, the patient has consented to  Procedure(s): LAPAROSCOPIC GASTRIC BAND REMOVAL WITH LAPAROSCOPIC GASTRIC SLEEVE RESECTION WITH UPPER ENDO (N/A) as a surgical intervention .  The patient's history has been reviewed, patient examined, no change in status, stable for surgery.  I have reviewed the patient's chart and labs.  Questions were answered to the patient's satisfaction.     Lundon Rosier H

## 2016-11-21 NOTE — Anesthesia Preprocedure Evaluation (Signed)
Anesthesia Evaluation  Patient identified by MRN, date of birth, ID band Patient awake    Reviewed: Allergy & Precautions, NPO status , Patient's Chart, lab work & pertinent test results  History of Anesthesia Complications (+) PONV and history of anesthetic complications  Airway Mallampati: I       Dental no notable dental hx. (+) Teeth Intact   Pulmonary    Pulmonary exam normal breath sounds clear to auscultation       Cardiovascular negative cardio ROS Normal cardiovascular exam Rhythm:Regular Rate:Normal     Neuro/Psych    GI/Hepatic Neg liver ROS, GERD  Medicated and Controlled,  Endo/Other  Morbid obesity  Renal/GU negative Renal ROS     Musculoskeletal   Abdominal (+) + obese,   Peds  Hematology negative hematology ROS (+)   Anesthesia Other Findings   Reproductive/Obstetrics negative OB ROS                             Anesthesia Physical Anesthesia Plan  ASA: III  Anesthesia Plan: General   Post-op Pain Management:    Induction: Intravenous  PONV Risk Score and Plan: 4 or greater and Ondansetron, Dexamethasone, Midazolam and Scopolamine patch - Pre-op  Airway Management Planned: Oral ETT  Additional Equipment:   Intra-op Plan:   Post-operative Plan: Extubation in OR  Informed Consent: I have reviewed the patients History and Physical, chart, labs and discussed the procedure including the risks, benefits and alternatives for the proposed anesthesia with the patient or authorized representative who has indicated his/her understanding and acceptance.   Dental advisory given  Plan Discussed with: CRNA and Surgeon  Anesthesia Plan Comments:         Anesthesia Quick Evaluation

## 2016-11-21 NOTE — Op Note (Signed)
Jamie Cooper [id number] [birth year] 11/21/2016  Preoperative diagnosis: morbid obesity; h/o adjustable gastric band procedure  Postoperative diagnosis: Same   Procedure: Upper endoscopy   Surgeon: Atilano InaEric M Katalina Magri M.D., FACS   Anesthesia: Gen.   Indications for procedure: 31 y.o. yo female undergoing a removal of adjustable gastric band due to a slip with a planned conversion to sleeve gastrectomy and an upper endoscopy was requested to evaluate the anastomosis.  Description of procedure: After we have completed the removal of the lapband and took down the fundus imbrication and the cictracix from the band itself, I scrubbed out and obtained the Pentax endoscope. I gently placed endoscope in the patient's oropharynx and gently glided it down the esophagus without any difficulty under direct visualization. Once I was in the stomach, I insufflated the stomach with air.  Upon further inspection of the gastric cavity, the mucosa appeared normal with the exception of the fundus on retroflexion - that section appeared thinner than other parts of the stomach. There was no mucosal violation. There were some areas of patchy gastritis in the fundus.  Stomach decompressed and the scope was withdrawn. The patient tolerated this portion of the procedure well. Please see Dr Allene PyoNewman's operative note for details.  Mary SellaEric M. Andrey CampanileWilson, MD, FACS General, Bariatric, & Minimally Invasive Surgery [facility], [place]

## 2016-11-21 NOTE — Op Note (Signed)
11/21/2016  10:14 AM  PATIENT:  Jamie Cooper, 31 y.o., female, MRN: [id number]  PREOP DIAGNOSIS:  MORBID OBESITY, slipped lap band  POSTOP DIAGNOSIS:   Morbid obesity, slipped lap band (posterior slip), adhesions  PROCEDURE:   Procedure(s): LAPAROSCOPIC GASTRIC BAND REMOVAL WITH UPPER ENDO AND LYSIS OF ADHESION (for one hour)  SURGEON:   Ovidio Kinavid Tamarah Bhullar, M.D.  Threasa HeadsASSISTANFredonia High[place]:   E. Wilson, M.D.  ANESTHESIA:   general  Anesthesiologist: Leilani AbleHatchett, Franklin, MD CRNA: Elyn PeersAllen, Sandra J, CRNA; Orest DikesPeters, Laura J, CRNA  General  EBL:  minimal  ml  BLOOD ADMINISTERED: none  DRAINS: none   LOCAL MEDICATIONS USED:   50 cc of a mixture of 20 cc of Experel and 30 cc of 1/4% marcaine  SPECIMEN:   None  COUNTS CORRECT:  YES  INDICATIONS FOR PROCEDURE:  Jamie Cooper is a 31 y.o. (DOB: [birth year]) white female whose primary care physician is Gilda CreaseNarendran, Mahendra, MD and comes for removal of lap band and conversion to a sleeve gastrectomy.   She had a APS lap band by Dr. [place]ubin [place]Gary Robinson [place] in 02/01/2011.   She did well early, but over the last year has had trouble with increase reflux and vomiting.  Evaluation has revealed that the lap band has slipped.  So she comes for a possible conversion to a sleeve gastrectomy   The indications and risks of the surgery were explained to the patient.  The risks include, but are not limited to, infection, bleeding, and nerve injury.  PROCEDURE:  The [facility]. She underwent a general anesthesia. Her abdomen was prepped and draped.   A timeout was held and surgical checklist run.   I accessed her abdominal cavity to the left upper quadrant with a 5 mm Ethicon Optiview trocar. I placed 5 additional trochars: a 5 mm in the right upper quadrant, a 15 mm trocar in the right paramedian area, a 5 mm trocar in the left paramedian area, a    left lateral 5 mm trocar, and a 5 mm trocar for the liver retractor the subxiphoid  location.   I placed a Nathenson liver retractor under the left lobe of the liver.   I carried out an abdominal expiration. The right and left lobes liver were unremarkable. The gallbladder was absent. The bowel that I could see was unremarkable. She had minimal adhesions, except under the left lobe of the liver where the lap band was situated.    I started the dissection taking the adhesions to the lap band down from the undersurface of left lobe of the liver using both cautery and Harmonic scalpel. After freeing the lap band up anteriorly I dissected the lap band from around the stomach, unclipped lap band, then cut the lap band out.  I removed the pieces of the lap band. I then took down adhesions from the prior gastric gastroplasty over the left lateral aspect of the stomach where the lap band had been. She had evidence of a posterior gastric slip involving the fundus of the stomach distended and posterior.    The fundus of the stomach, which was entrapped in the slippage, was attenuated and thin.  I thought that I took down most adhesions entrapping the fundus of the stomach. I divided cicatrix with the lap band had been to release the underlying stomach.   At this point, Dr. Andrey CampanileWilson broke scrub and did an upper endoscopy.  At endoscopy and on retroflexing the endoscope,  he could see where the stomach had been entrapped with some debris in the fundus of the stomach.  The area of entrapped stomach was thinned out and beat up by the dissection. Thuough much of this beat up stomach/fundus would probably be excluded in a sleeve gastrectomy, I thought she would best served by stopping the operation this point and bring her back for elective sleeve gastrectomy in around 3 months.   I then removed the liver retractor. I desufflated the abdomen and cut down over her lap band port in the left upper quadrant and removed the lap band port. There was a backing to the port of (what looked like polypropylene) mesh  that I removed. The mesh did not appear to be sewn to the port and I wondered if he had used absorbable sutures to attached the port to the mesh.   I reinsufflated the abdominal cavity. I placed a single 0 suture at the 15 mm port site in the right paramedian area. I irrigated the abdomen out with about 1 L of saline. There was no evidence of bleeding either intra-abdominally rate the port sites. Each port was removed and sewn closed with 4-0 Monocryl suture and painted with Dermabond.   The sponge and needle count were correct in the case. She was transferred to recovery room in good condition. I'll plan to keep her for 24-hour observation.  Ovidio Kin, MD, [facility] Surgery Pager: [phone] Office phone:  [phone]

## 2016-11-21 NOTE — Anesthesia Postprocedure Evaluation (Signed)
Anesthesia Post Note  Patient: Jamie Cooper  Procedure(s) Performed: Procedure(s) (LRB): LAPAROSCOPIC GASTRIC BAND REMOVAL WITH UPPER ENDO AND LYSIS OF ADHESION (N/A)     Patient location during evaluation: PACU Anesthesia Type: General Level of consciousness: awake Pain management: pain level controlled Vital Signs Assessment: post-procedure vital signs reviewed and stable Respiratory status: spontaneous breathing Cardiovascular status: stable Postop Assessment: no signs of nausea or vomiting Anesthetic complications: no    Last Vitals:  Vitals:   11/21/16 1145 11/21/16 1159  BP:  129/87  Pulse:  84  Resp:  12  Temp: 36.9 C 36.9 C  SpO2:  100%    Last Pain:  Vitals:   11/21/16 1130  TempSrc:   PainSc: 1    Pain Goal: Patients Stated Pain Goal: 3 (11/21/16 0556)               Hunter Pinkard JR,JOHN Susann GivensFRANKLIN

## 2016-11-22 DIAGNOSIS — K9509 Other complications of gastric band procedure: Secondary | ICD-10-CM | POA: Diagnosis not present

## 2016-11-22 NOTE — Progress Notes (Signed)
Discharge criteria met. Gave D/c instructions to patient and patient husband. Copy signed and placed on front of chart. Both verbalized and demonstrated understanding.  IV d/c'd. Denies any further questions or concerns and follow up appointment.  Patient already has pain medication at home.

## 2016-11-22 NOTE — Progress Notes (Signed)
Provided patient with business card for any further information.

## 2016-11-22 NOTE — Discharge Instructions (Signed)
° ° °  [facility] - DISCHARGE INSTRUCTIONS TO PATIENT  Activity:  Driving - May drive when off pain meds   Lifting - Take it easy for about 10 days, then no limit  Wound Care:   Leave incision dry until tomorrow, then may shower.  Diet:  Low cal diet.  Follow up appointment:  Call Dr. Allene PyoNewman's office [facility] at [phone] for an appointment in 2 weeks.  Medications and dosages:  Resume your home medications.  You have a prescription for:  Liquid roxicet  Call Dr. Ezzard StandingNewman or his office  [phone]) if you have:  Temperature greater than 100.4,  Persistent nausea and vomiting,  Severe uncontrolled pain,  Redness, tenderness, or signs of infection (pain, swelling, redness, odor or green/yellow discharge around the site),  Difficulty breathing, headache or visual disturbances,  Any other questions or concerns you may have after discharge.  In an emergency, call 911 or go to an Emergency Department at a nearby hospital.

## 2016-11-22 NOTE — Discharge Summary (Signed)
Physician Discharge Summary  Patient ID:  Jamie Cooper  MRN: [id number]  DOB/AGE: January 31, 1986 31 y.o.  Admit date: 11/21/2016 Discharge date: 11/22/2016  Discharge Diagnoses:  1. Posterior slip of lap band  2. Morbid obesity             BMI - 39.9 3.  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 Sharlee Blew, anesthesiology back ground, Orpah Clinton is his PA.        She has seen Dr. 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 She is on Gralise (gabenpentin) 4. History of kidney stones   Active Problems:   History of removal of laparoscopic gastric banding device   Operation: Procedure(s): LAPAROSCOPIC GASTRIC BAND REMOVAL WITH UPPER ENDO AND LYSIS OF ADHESION (for one hour) on 11/21/2016  Discharged Condition: good  Hospital Course: Jamie Cooper is an 31 y.o. female whose primary care physician is Gilda Crease, MD and who was admitted 11/21/2016 with a chief complaint of slip of lap band.   The lap band was placed by Dr. Reubin Milan in [place] in 02/01/2011.  She did well early, but had increasing trouble with nausea and vomiting over the last year.  UGI revealed a slip of the lap band.  We discussed removing her lap band and revising her to a sleeve gastrectomy.  She was brought to the operating room on 11/21/2016 and underwent  LAPAROSCOPIC GASTRIC BAND REMOVAL WITH UPPER ENDO AND LYSIS OF ADHESION (for one hour).   I thought that the fundus of the stomach was too beat up to proceed with a sleeve gastrectomy at this operation - so we will bring her back in 3 months to do her sleeve gastrectomy.  She is now one day post op.  She is eating well. Has no nausea and is ready to go home.  The discharge instructions were reviewed with the patient.  Consults: None  Significant Diagnostic Studies: Results for orders placed or performed during the  hospital encounter of 11/21/16  Surgical PCR screen  Result Value Ref Range   MRSA, PCR NEGATIVE NEGATIVE   Staphylococcus aureus NEGATIVE NEGATIVE  Pregnancy, urine STAT morning of surgery  Result Value Ref Range   Preg Test, Ur NEGATIVE NEGATIVE    No results found.  Discharge Exam:  Vitals:   11/22/16 0004 11/22/16 0518  BP: 115/68 100/62  Pulse: 86 69  Resp: 16 16  Temp: 98.7 F (37.1 C) 98.8 F (37.1 C)  SpO2: 98% 99%    General: Obese wF who is alert and generally healthy appearing.  Lungs: Clear to auscultation and symmetric breath sounds. Heart:  RRR. No murmur or rub. Abdomen: Soft. Normal bowel sounds.  Her incisions look good.  Discharge Medications:   Allergies as of 11/22/2016      Reactions   Lyrica [pregabalin] Swelling   Arms and legs      Medication List    TAKE these medications   citalopram 20 MG tablet Commonly known as:  CELEXA Take 20 mg by mouth at bedtime.   methocarbamol 500 MG tablet Commonly known as:  ROBAXIN Take 500 mg by mouth 2 (two) times daily.   topiramate 25 MG capsule Commonly known as:  TOPAMAX Take 50 mg by mouth at bedtime.   traMADol 50 MG tablet Commonly known as:  ULTRAM Take  50 mg by mouth every 12 (twelve) hours as needed for moderate pain.   Vitamin D (Ergocalciferol) 50000 units Caps capsule Commonly known as:  DRISDOL Take 50,000 Units by mouth every 7 (seven) days. Thursdays            Discharge Care Instructions        Start     Ordered   11/22/16 0000  Diet - low sodium heart healthy     11/22/16 0720   11/22/16 0000  Increase activity slowly     11/22/16 0720      Disposition: Final discharge disposition not confirmed  Discharge Instructions    Diet - low sodium heart healthy    Complete by:  As directed    Increase activity slowly    Complete by:  As directed       Follow-up Information    Ovidio KinNewman, Bryten Maher, MD. Go on 12/07/2016.   Specialty:  General Surgery Why:  at 0930 Contact  information: [address] [place] [address] [phone]        Ovidio KinNewman, Osmin Welz, MD Follow up.   Specialty:  General Surgery Contact information: [address] [place] [address] [phone]            Signed: Ovidio Kinavid Prudencio Velazco, M.D., [facility] Office:  [phone]  11/22/2016, 7:22 AM

## 2016-12-05 ENCOUNTER — Ambulatory Visit: Payer: BLUE CROSS/BLUE SHIELD

## 2017-01-24 NOTE — Progress Notes (Signed)
Please place orders in Epic as patient is being scheduled for a pre-op appointment! Thank you! 

## 2017-01-25 ENCOUNTER — Ambulatory Visit: Payer: Self-pay | Admitting: Surgery

## 2017-02-07 NOTE — Patient Instructions (Signed)
Jamie Cooper  02/07/2017   Your procedure is scheduled on: Tuesday, Dec. 4, 2018   [facility]   [place]vators to 3rd floor to  Short Stay Center at 5:30 AM.    Call this number if you have problems the morning of surgery [phone]    Remember: ONLY 1 PERSON MAY GO WITH YOU TO SHORT STAY TO GET  READY MORNING OF YOUR SURGERY.   Do not eat food or drink liquids :After Midnight.    Take these medicines the morning of surgery with A SIP OF WATER: None              You may not have any metal on your body including hair pins, jewelry, and body piercings              Do not wear make-up, lotions, powders, perfumes, or  deodorant             Do not wear nail polish.  Do not shave  48 hours prior to surgery.                Do not bring valuables to the hospital. [facility] IS NOT             RESPONSIBLE   FOR VALUABLES.   Contacts, dentures or bridgework may not be worn into surgery.   Leave suitcase in the car. After surgery it may be brought to your room.              Please read over the following fact sheets you were given: _____________________________________________________________________             [facility] - Preparing for Surgery Before surgery, you can play an important role.  Because skin is not sterile, your skin needs to be as free of germs as possible.  You can reduce the number of germs on your skin by washing with CHG (chlorahexidine gluconate) soap before surgery.  CHG is an antiseptic cleaner which kills germs and bonds with the skin to continue killing germs even after washing. Please DO NOT use if you have an allergy to CHG or antibacterial soaps.  If your skin becomes reddened/irritated stop using the CHG and inform your nurse when you arrive at Short Stay. Do not shave (including legs and underarms) for at least 48 hours prior to the first CHG shower.  You may shave your face/neck.  Please follow these  instructions carefully:  1.  Shower with CHG Soap the night before surgery and the  morning of surgery.  2.  If you choose to wash your hair, wash your hair first as usual with your normal  shampoo.  3.  After you shampoo, rinse your hair and body thoroughly to remove the shampoo.                             4.  Use CHG as you would any other liquid soap.  You can apply chg directly to the skin and wash.  Gently with a scrungie or clean washcloth.  5.  Apply the CHG Soap to your body ONLY FROM THE NECK DOWN.   Do   not use on face/ open  Wound or open sores. Avoid contact with eyes, ears mouth and   genitals (private parts).                       Wash face,  Genitals (private parts) with your normal soap.             6.  Wash thoroughly, paying special attention to the area where your    surgery  will be performed.  7.  Thoroughly rinse your body with warm water from the neck down.  8.  DO NOT shower/wash with your normal soap after using and rinsing off the CHG Soap.                9.  Pat yourself dry with a clean towel.            10.  Wear clean pajamas.            11.  Place clean sheets on your bed the night of your first shower and do not  sleep with pets. Day of Surgery : Do not apply any lotions/deodorants the morning of surgery.  Please wear clean clothes to the hospital/surgery center.  FAILURE TO FOLLOW THESE INSTRUCTIONS MAY RESULT IN THE CANCELLATION OF YOUR SURGERY  PATIENT SIGNATURE_________________________________  NURSE SIGNATURE__________________________________  ________________________________________________________________________

## 2017-02-12 ENCOUNTER — Encounter (HOSPITAL_COMMUNITY)
Admission: RE | Admit: 2017-02-12 | Discharge: 2017-02-12 | Disposition: A | Discharge status: Home / Self Care | Payer: BLUE CROSS/BLUE SHIELD | Source: Ambulatory Visit | Attending: Surgery | Admitting: Surgery

## 2017-02-12 ENCOUNTER — Encounter (HOSPITAL_COMMUNITY): Payer: Self-pay

## 2017-02-12 ENCOUNTER — Other Ambulatory Visit: Payer: Self-pay

## 2017-02-12 LAB — COMPREHENSIVE METABOLIC PANEL
ALBUMIN: 4 g/dL (ref 3.5–5.0)
ALT: 22 U/L (ref 14–54)
ANION GAP: 8 (ref 5–15)
AST: 22 U/L (ref 15–41)
Alkaline Phosphatase: 53 U/L (ref 38–126)
BUN: 14 mg/dL (ref 6–20)
CO2: 23 mmol/L (ref 22–32)
Calcium: 9 mg/dL (ref 8.9–10.3)
Chloride: 107 mmol/L (ref 101–111)
Creatinine, Ser: 0.61 mg/dL (ref 0.44–1.00)
GFR calc non Af Amer: >60 mL/min (ref >60)
GLUCOSE: 90 mg/dL (ref 65–99)
Potassium: 3.7 mmol/L (ref 3.5–5.1)
SODIUM: 138 mmol/L (ref 135–145)
TOTAL PROTEIN: 7.1 g/dL (ref 6.5–8.1)
Total Bilirubin: 0.7 mg/dL (ref 0.3–1.2)

## 2017-02-12 LAB — CBC WITH DIFFERENTIAL/PLATELET
BASOS PCT: 1 %
Basophils Absolute: 0.1 10*3/uL (ref 0.0–0.1)
EOS ABS: 0.3 10*3/uL (ref 0.0–0.7)
EOS PCT: 4 %
HCT: 36.8 % (ref 36.0–46.0)
Hemoglobin: 11.5 g/dL — ABNORMAL LOW (ref 12.0–15.0)
Lymphocytes Relative: 28 %
Lymphs Abs: 2 10*3/uL (ref 0.7–4.0)
MCH: 23.8 pg — AB (ref 26.0–34.0)
MCHC: 31.3 g/dL (ref 30.0–36.0)
MCV: 76.2 fL — ABNORMAL LOW (ref 78.0–100.0)
MONO ABS: 0.5 10*3/uL (ref 0.1–1.0)
MONOS PCT: 7 %
NEUTROS PCT: 60 %
Neutro Abs: 4.2 10*3/uL (ref 1.7–7.7)
PLATELETS: 251 10*3/uL (ref 150–400)
RBC: 4.83 MIL/uL (ref 3.87–5.11)
RDW: 16.2 % — ABNORMAL HIGH (ref 11.5–15.5)
WBC: 7 10*3/uL (ref 4.0–10.5)

## 2017-02-12 NOTE — H&P (Signed)
Jamie Cooper  [facility] Patient #: [id number] DOB: October 12, 1985 Married / Language: English / Race: White Female  History of Present Illness   The patient is a 31 year old female who presents with a complaint of lap band management.  The PCP is Jamie [facility]  She comes with her husband and daughter.  She has a slipped lap band. I removed the lap sleeve on 11/21/2016, but because the stomach was beat up, I did not proceed with sleeve gastrectomy. So on 11/21/2016 - I just removed the lap band.  She has done well since surgery with no nausea, abdominal pain or discomfort. Unfortunately, she has gained 10 pounds of weight since I last saw her. She is on for sleeve gastrectomy on 02/13/2017. She is ready.  The patient is interested in the sleeve gastrectomy. I discussed with the patient the indications and risks of bariatric surgery. The potential risks of surgery include, but are not limited to, bleeding, infection, leak from the bowel, DVT and PE, open surgery, long term nutrition consequences, and death. She understans with the prior lap band, she is an increased risk of leak. The patient understands the importance of compliance and long term follow-up with our group after surgery.  History of lap band problems: She [place]01[place]. She said that her initial weight was >300 pounds and she lost to under 200 pounds. Then she had GB surgery by Jamie Cooper in 2014 and had trouble adjusting her lap band. She would develop nause, because the lap band would be too tight and then it would be too loose. She thinks that her last fill was in 2015. But she is unsure how much fluid is in the lap band. (The last note I have from Jamie Cooper is dated 10/13/2014 - he stated he put in 5.6 cc in the lap band - this actually worked out to about what I pulled out) Because  of conitnued trouble, [place] was [place] in [place]. He obtained an UGI on 07/30/2015. She never heard back from his office, but her recommended revising her to a sleeve gastrectomy. She does not want a sleeve gastrectomy and was hoping to work wiht her lap band. She has had good success with the lap band and had lost over 100 pounds. [facility] show on the UGI that she has an anterior slip of her lap band. This is kind of mentioned in the body of report - but they do not spell it out.  Past Medical History: 1. Removal of lap band - 11/21/2016 For sleeve gastrectomy 02/13/2017 2. [place] - 02[place]01[place]2011 Evidence of slip of the lap band 3. Lap appendectomy and cyst on intestines - 2007 - Jamie Cooper 4. Lap chole - 2014 - Jamie Cooper 5. Back issues She has had ablations, epidurals, and had at least 2 nerve blocks of her back She has pain down her left leg and numbess in her left foot. She just had a nerve block last week. She is seeing Jamie Cooper, anesthesiology back ground, Jamie Cooper is his PA. [facility]) He said that she had DJD of the back She is on ibuprofen - which is not helping much. She is on Gralise (gabenpentin) Her back is still an issue. We talked about NSAID use adn its risks after surgery. 6. History of  kidney stones  Social History: Married. His name is Christiane Cooper. Her husband works at [employer]. He had a gastric bypass in 2011. He has lost >100 pounds She has a 32 yo daughter, Jamie Cooper  She is a Futures traderhomemaker. She has applied for disability - but that is pending. They have an appt with a physician at the end of November.   Allergies Jamie Cooper; 01/31/2017 11:25 AM) Lyrica CR *Psychotherapeutic and Neurological Agents - Misc.**  Swelling. Allergies Reconciled   Medication  History Jamie Cooper; 01/31/2017 11:27 AM) TraMADol HCl (50MG  Tablet, Oral) Active. Topiramate (25MG  Tablet, Oral) Active. Vitamin D (Ergocalciferol) (50000UNIT Capsule, Oral) Active. Methocarbamol (500MG  Tablet, Oral) Active. Citalopram Hydrobromide (20MG  Tablet, Oral) Active. Multivitamin Adult (Oral) Active. Medications Reconciled  Vitals Jamie Sauger([name] RMA; 01/31/2017 11:25 AM) 01/31/2017 11:24 AM Weight: 251.2 lb Height: 68in Body Surface Area: 2.25 m Body Mass Index: 38.19 kg/m  Temp.: 98.21F  Pulse: 90 (Regular)  BP: 130/72 (Sitting, Left Arm, Standard)   Physical Exam  General: Obese WF alert and generally healthy appearing. HEENT: Normal. Pupils equal.  Neck: Supple. No mass. No thyroid mass.  Lymph Nodes: No supraclavicular or cervical nodes.  Lungs: Clear to auscultation and symmetric breath sounds. Heart: RRR. No murmur or rub.  Abdomen: Soft. No mass. No tenderness. No hernia. Normal bowel sounds.  She is about 1/2 apple and 1/2 pear. Incisions look good.  Extremities: Good strength in upper and lower extremities.  Assessment & Plan  1.  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 Melina CopaG. [place], [place] - evidence of a slip in the lap band, so it was removed  Lap band removed - 11/21/2016 - D. Anakaren Campion  Plan:  1) Conversion to sleeve gastrectomy scheduled 02/13/2017  2) She has prescriptions for nausea, and Protonix  3) Will give prescription for Oxycodone.  2. MORBID OBESITY (E66.01)  Weight - 251, BMI - 38.2 3.  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 She just had a nerve block last week.  She is seeing Jamie Cooper, anesthesiology back ground, Jamie Cooper is his PA.  She has [facility] ([facility]) He said that she had DJD of the back  4. Lap appendectomy and cyst on intestines - 2007 - Jamie Cooper  Lap chole - 2014 - Jamie Cooper 5. History of kidney stones   Ovidio Kinavid Maguire Sime, MD, [facility] Pager: [phone] Office phone:  [phone]

## 2017-02-12 NOTE — Anesthesia Preprocedure Evaluation (Signed)
Anesthesia Evaluation  Patient identified by MRN, date of birth, ID band Patient awake    Reviewed: Allergy & Precautions, NPO status , Patient's Chart, lab work & pertinent test results  History of Anesthesia Complications (+) PONV and history of anesthetic complications  Airway Mallampati: I       Dental no notable dental hx. (+) Teeth Intact   Pulmonary    Pulmonary exam normal breath sounds clear to auscultation       Cardiovascular negative cardio ROS Normal cardiovascular exam Rhythm:Regular Rate:Normal     Neuro/Psych    GI/Hepatic Neg liver ROS, GERD  Medicated and Controlled,  Endo/Other  Morbid obesity  Renal/GU negative Renal ROS     Musculoskeletal   Abdominal (+) + obese,   Peds  Hematology negative hematology ROS (+)   Anesthesia Other Findings   Reproductive/Obstetrics negative OB ROS                             Anesthesia Physical  Anesthesia Plan  ASA: III  Anesthesia Plan: General   Post-op Pain Management:    Induction: Intravenous  PONV Risk Score and Plan: 4 or greater and Ondansetron, Dexamethasone, Midazolam, Scopolamine patch - Pre-op and Propofol infusion  Airway Management Planned: Oral ETT  Additional Equipment:   Intra-op Plan:   Post-operative Plan: Extubation in OR  Informed Consent: I have reviewed the patients History and Physical, chart, labs and discussed the procedure including the risks, benefits and alternatives for the proposed anesthesia with the patient or authorized representative who has indicated his/her understanding and acceptance.   Dental advisory given  Plan Discussed with: CRNA and Surgeon  Anesthesia Plan Comments:         Anesthesia Quick Evaluation

## 2017-02-12 NOTE — Pre-Procedure Instructions (Signed)
CBC and CMP faxed to Dr. Ezzard StandingNewman via epic.

## 2017-02-13 ENCOUNTER — Encounter (HOSPITAL_COMMUNITY)
Admission: RE | Disposition: A | Discharge status: Home / Self Care | Payer: Self-pay | Source: Ambulatory Visit | Attending: Surgery

## 2017-02-13 ENCOUNTER — Inpatient Hospital Stay (HOSPITAL_COMMUNITY)
Admission: RE | Admit: 2017-02-13 | Discharge: 2017-02-14 | DRG: 621 | Disposition: A | Discharge status: Home / Self Care | Payer: BLUE CROSS/BLUE SHIELD | Source: Ambulatory Visit | Attending: Surgery | Admitting: Surgery

## 2017-02-13 ENCOUNTER — Encounter (HOSPITAL_COMMUNITY): Payer: Self-pay | Admitting: *Deleted

## 2017-02-13 ENCOUNTER — Other Ambulatory Visit: Payer: Self-pay

## 2017-02-13 ENCOUNTER — Inpatient Hospital Stay (HOSPITAL_COMMUNITY): Payer: BLUE CROSS/BLUE SHIELD | Admitting: Certified Registered Nurse Anesthetist

## 2017-02-13 DIAGNOSIS — R110 Nausea: Secondary | ICD-10-CM | POA: Diagnosis not present

## 2017-02-13 DIAGNOSIS — Z87442 Personal history of urinary calculi: Secondary | ICD-10-CM | POA: Diagnosis not present

## 2017-02-13 DIAGNOSIS — M479 Spondylosis, unspecified: Secondary | ICD-10-CM | POA: Diagnosis present

## 2017-02-13 DIAGNOSIS — G8929 Other chronic pain: Secondary | ICD-10-CM | POA: Diagnosis present

## 2017-02-13 DIAGNOSIS — Z9049 Acquired absence of other specified parts of digestive tract: Secondary | ICD-10-CM

## 2017-02-13 DIAGNOSIS — M545 Low back pain: Secondary | ICD-10-CM | POA: Diagnosis present

## 2017-02-13 DIAGNOSIS — K660 Peritoneal adhesions (postprocedural) (postinfection): Secondary | ICD-10-CM | POA: Diagnosis present

## 2017-02-13 DIAGNOSIS — Z6838 Body mass index (BMI) 38.0-38.9, adult: Secondary | ICD-10-CM | POA: Diagnosis not present

## 2017-02-13 HISTORY — PX: LAPAROSCOPIC GASTRIC SLEEVE RESECTION: SHX5895

## 2017-02-13 LAB — CREATININE, SERUM
Creatinine, Ser: 0.68 mg/dL (ref 0.44–1.00)
GFR calc Af Amer: >60 mL/min (ref >60)
GFR calc non Af Amer: >60 mL/min (ref >60)

## 2017-02-13 LAB — HEMOGLOBIN AND HEMATOCRIT, BLOOD
HEMATOCRIT: 35.2 % — AB (ref 36.0–46.0)
HEMOGLOBIN: 10.9 g/dL — AB (ref 12.0–15.0)

## 2017-02-13 LAB — PREGNANCY, URINE: PREG TEST UR: NEGATIVE

## 2017-02-13 SURGERY — GASTRECTOMY, SLEEVE, LAPAROSCOPIC
Anesthesia: General

## 2017-02-13 MED ORDER — ONDANSETRON HCL 4 MG/2ML IJ SOLN
INTRAMUSCULAR | Status: AC
  Filled 2017-02-13: qty 2

## 2017-02-13 MED ORDER — PROPOFOL 10 MG/ML IV BOLUS
INTRAVENOUS | Status: DC | PRN
  Administered 2017-02-13: 200 mg via INTRAVENOUS

## 2017-02-13 MED ORDER — CHLORHEXIDINE GLUCONATE 4 % EX LIQD
60.0000 mL | Freq: Once | CUTANEOUS | Status: DC
Start: 2017-02-14 — End: 2017-02-13

## 2017-02-13 MED ORDER — 0.9 % SODIUM CHLORIDE (POUR BTL) OPTIME
TOPICAL | Status: DC | PRN
  Administered 2017-02-13: 1000 mL

## 2017-02-13 MED ORDER — MIDAZOLAM HCL 5 MG/5ML IJ SOLN
INTRAMUSCULAR | Status: DC | PRN
  Administered 2017-02-13: 2 mg via INTRAVENOUS

## 2017-02-13 MED ORDER — LACTATED RINGERS IR SOLN
Status: DC | PRN
  Administered 2017-02-13: 1000 mL

## 2017-02-13 MED ORDER — SCOPOLAMINE 1 MG/3DAYS TD PT72
1.0000 | MEDICATED_PATCH | TRANSDERMAL | Status: DC
  Administered 2017-02-13: 1.5 mg via TRANSDERMAL
  Filled 2017-02-13: qty 1

## 2017-02-13 MED ORDER — SUGAMMADEX SODIUM 500 MG/5ML IV SOLN
INTRAVENOUS | Status: AC
  Filled 2017-02-13: qty 5

## 2017-02-13 MED ORDER — CHLORHEXIDINE GLUCONATE 4 % EX LIQD: 60.0000 mL | Freq: Once | CUTANEOUS | Status: DC

## 2017-02-13 MED ORDER — ACETAMINOPHEN 500 MG PO TABS
1000.0000 mg | ORAL_TABLET | ORAL | Status: AC
  Administered 2017-02-13: 1000 mg via ORAL
  Filled 2017-02-13: qty 2

## 2017-02-13 MED ORDER — PREMIER PROTEIN SHAKE
2.0000 [oz_av] | ORAL | Status: DC
  Administered 2017-02-14 (×3): 2 [oz_av] via ORAL

## 2017-02-13 MED ORDER — BUPIVACAINE LIPOSOME 1.3 % IJ SUSP
20.0000 mL | Freq: Once | INTRAMUSCULAR | Status: DC
  Filled 2017-02-13: qty 20

## 2017-02-13 MED ORDER — LIDOCAINE HCL 2 % IJ SOLN
INTRAMUSCULAR | Status: AC
  Filled 2017-02-13: qty 20

## 2017-02-13 MED ORDER — MORPHINE SULFATE (PF) 2 MG/ML IV SOLN
1.0000 mg | INTRAVENOUS | Status: DC | PRN
  Administered 2017-02-13: 2 mg via INTRAVENOUS
  Filled 2017-02-13 (×2): qty 1

## 2017-02-13 MED ORDER — STERILE WATER FOR IRRIGATION IR SOLN
Status: DC | PRN
  Administered 2017-02-13: 1000 mL

## 2017-02-13 MED ORDER — FENTANYL CITRATE (PF) 100 MCG/2ML IJ SOLN
INTRAMUSCULAR | Status: DC | PRN
  Administered 2017-02-13 (×2): 50 ug via INTRAVENOUS
  Administered 2017-02-13: 100 ug via INTRAVENOUS
  Administered 2017-02-13 (×3): 50 ug via INTRAVENOUS

## 2017-02-13 MED ORDER — PROMETHAZINE HCL 25 MG/ML IJ SOLN
6.2500 mg | INTRAMUSCULAR | Status: DC | PRN
  Administered 2017-02-13: 6.25 mg via INTRAVENOUS

## 2017-02-13 MED ORDER — DEXAMETHASONE SODIUM PHOSPHATE 10 MG/ML IJ SOLN
INTRAMUSCULAR | Status: DC | PRN
  Administered 2017-02-13: 6 mg via INTRAVENOUS

## 2017-02-13 MED ORDER — DEXAMETHASONE SODIUM PHOSPHATE 10 MG/ML IJ SOLN
INTRAMUSCULAR | Status: AC
  Filled 2017-02-13: qty 1

## 2017-02-13 MED ORDER — ACETAMINOPHEN 160 MG/5ML PO SOLN: 650.0000 mg | ORAL | Status: DC | PRN

## 2017-02-13 MED ORDER — KCL IN DEXTROSE-NACL 20-5-0.45 MEQ/L-%-% IV SOLN
INTRAVENOUS | Status: DC
  Administered 2017-02-13 – 2017-02-14 (×2): via INTRAVENOUS
  Filled 2017-02-13 (×2): qty 1000

## 2017-02-13 MED ORDER — SUCCINYLCHOLINE CHLORIDE 200 MG/10ML IV SOSY
PREFILLED_SYRINGE | INTRAVENOUS | Status: AC
  Filled 2017-02-13: qty 10

## 2017-02-13 MED ORDER — BUPIVACAINE HCL (PF) 0.25 % IJ SOLN
INTRAMUSCULAR | Status: AC
  Filled 2017-02-13: qty 30

## 2017-02-13 MED ORDER — KETAMINE HCL 10 MG/ML IJ SOLN
INTRAMUSCULAR | Status: DC | PRN
  Administered 2017-02-13: 30 mg via INTRAVENOUS

## 2017-02-13 MED ORDER — LIDOCAINE 2% (20 MG/ML) 5 ML SYRINGE
INTRAMUSCULAR | Status: DC | PRN
  Administered 2017-02-13: 1.5 mg/kg/h via INTRAVENOUS

## 2017-02-13 MED ORDER — LACTATED RINGERS IV SOLN
INTRAVENOUS | Status: DC
  Administered 2017-02-13 (×2): via INTRAVENOUS

## 2017-02-13 MED ORDER — MIDAZOLAM HCL 2 MG/2ML IJ SOLN
INTRAMUSCULAR | Status: AC
  Filled 2017-02-13: qty 2

## 2017-02-13 MED ORDER — CEFOTETAN DISODIUM-DEXTROSE 2-2.08 GM-%(50ML) IV SOLR
2.0000 g | INTRAVENOUS | Status: AC
  Administered 2017-02-13: 2 g via INTRAVENOUS

## 2017-02-13 MED ORDER — PANTOPRAZOLE SODIUM 40 MG IV SOLR
40.0000 mg | Freq: Every day | INTRAVENOUS | Status: DC
  Administered 2017-02-13: 40 mg via INTRAVENOUS
  Filled 2017-02-13: qty 40

## 2017-02-13 MED ORDER — FENTANYL CITRATE (PF) 100 MCG/2ML IJ SOLN
INTRAMUSCULAR | Status: AC
  Filled 2017-02-13: qty 2

## 2017-02-13 MED ORDER — HEPARIN SODIUM (PORCINE) 5000 UNIT/ML IJ SOLN
5000.0000 [IU] | INTRAMUSCULAR | Status: AC
  Administered 2017-02-13: 5000 [IU] via SUBCUTANEOUS
  Filled 2017-02-13: qty 1

## 2017-02-13 MED ORDER — OXYCODONE HCL 5 MG/5ML PO SOLN
5.0000 mg | ORAL | Status: DC | PRN
  Administered 2017-02-13 – 2017-02-14 (×2): 5 mg via ORAL
  Filled 2017-02-13 (×2): qty 5

## 2017-02-13 MED ORDER — FENTANYL CITRATE (PF) 250 MCG/5ML IJ SOLN
INTRAMUSCULAR | Status: AC
  Filled 2017-02-13: qty 5

## 2017-02-13 MED ORDER — GABAPENTIN 300 MG PO CAPS
300.0000 mg | ORAL_CAPSULE | ORAL | Status: AC
  Administered 2017-02-13: 300 mg via ORAL
  Filled 2017-02-13: qty 1

## 2017-02-13 MED ORDER — MEPERIDINE HCL 50 MG/ML IJ SOLN: 6.2500 mg | INTRAMUSCULAR | Status: DC | PRN

## 2017-02-13 MED ORDER — APREPITANT 40 MG PO CAPS
40.0000 mg | ORAL_CAPSULE | ORAL | Status: AC
  Administered 2017-02-13: 40 mg via ORAL
  Filled 2017-02-13: qty 1

## 2017-02-13 MED ORDER — TISSEEL VH 10 ML EX KIT
PACK | CUTANEOUS | Status: AC
  Filled 2017-02-13: qty 1

## 2017-02-13 MED ORDER — BUPIVACAINE HCL 0.25 % IJ SOLN
INTRAMUSCULAR | Status: DC | PRN
  Administered 2017-02-13: 30 mL

## 2017-02-13 MED ORDER — LIDOCAINE 2% (20 MG/ML) 5 ML SYRINGE
INTRAMUSCULAR | Status: DC | PRN
  Administered 2017-02-13: 100 mg via INTRAVENOUS

## 2017-02-13 MED ORDER — ONDANSETRON HCL 4 MG/2ML IJ SOLN
4.0000 mg | INTRAMUSCULAR | Status: DC | PRN
  Administered 2017-02-13 – 2017-02-14 (×3): 4 mg via INTRAVENOUS
  Filled 2017-02-13 (×3): qty 2

## 2017-02-13 MED ORDER — PROPOFOL 10 MG/ML IV BOLUS
INTRAVENOUS | Status: AC
  Filled 2017-02-13: qty 20

## 2017-02-13 MED ORDER — SUGAMMADEX SODIUM 500 MG/5ML IV SOLN
INTRAVENOUS | Status: DC | PRN
  Administered 2017-02-13: 250 mg via INTRAVENOUS

## 2017-02-13 MED ORDER — TISSEEL VH 10 ML EX KIT
PACK | CUTANEOUS | Status: DC | PRN
  Administered 2017-02-13: 1

## 2017-02-13 MED ORDER — FENTANYL CITRATE (PF) 100 MCG/2ML IJ SOLN
25.0000 ug | INTRAMUSCULAR | Status: DC | PRN
  Administered 2017-02-13 (×2): 50 ug via INTRAVENOUS

## 2017-02-13 MED ORDER — ROCURONIUM BROMIDE 50 MG/5ML IV SOSY
PREFILLED_SYRINGE | INTRAVENOUS | Status: DC | PRN
  Administered 2017-02-13 (×2): 10 mg via INTRAVENOUS
  Administered 2017-02-13: 50 mg via INTRAVENOUS
  Administered 2017-02-13: 10 mg via INTRAVENOUS

## 2017-02-13 MED ORDER — ONDANSETRON HCL 4 MG/2ML IJ SOLN
INTRAMUSCULAR | Status: DC | PRN
Start: 2017-02-13 — End: 2017-02-13
  Administered 2017-02-13: 4 mg via INTRAVENOUS

## 2017-02-13 MED ORDER — BUPIVACAINE LIPOSOME 1.3 % IJ SUSP
INTRAMUSCULAR | Status: DC | PRN
  Administered 2017-02-13: 20 mL

## 2017-02-13 MED ORDER — KETAMINE HCL 10 MG/ML IJ SOLN
INTRAMUSCULAR | Status: AC
  Filled 2017-02-13: qty 1

## 2017-02-13 MED ORDER — ROCURONIUM BROMIDE 50 MG/5ML IV SOSY
PREFILLED_SYRINGE | INTRAVENOUS | Status: AC
  Filled 2017-02-13: qty 5

## 2017-02-13 MED ORDER — PROMETHAZINE HCL 25 MG/ML IJ SOLN
INTRAMUSCULAR | Status: AC
  Filled 2017-02-13: qty 1

## 2017-02-13 MED ORDER — CEFOTETAN DISODIUM-DEXTROSE 2-2.08 GM-%(50ML) IV SOLR
INTRAVENOUS | Status: AC
  Filled 2017-02-13: qty 50

## 2017-02-13 MED ORDER — LIDOCAINE 2% (20 MG/ML) 5 ML SYRINGE
INTRAMUSCULAR | Status: AC
  Filled 2017-02-13: qty 5

## 2017-02-13 SURGICAL SUPPLY — 59 items
APPLICATOR COTTON TIP 6IN STRL (MISCELLANEOUS) IMPLANT
APPLIER CLIP ROT 10 11.4 M/L (STAPLE)
APPLIER CLIP ROT 13.4 12 LRG (CLIP) ×3
BLADE SURG 15 STRL LF DISP TIS (BLADE) ×1 IMPLANT
BLADE SURG 15 STRL SS (BLADE) ×2
CABLE HIGH FREQUENCY MONO STRZ (ELECTRODE) IMPLANT
CHLORAPREP W/TINT 26ML (MISCELLANEOUS) ×3 IMPLANT
CLIP APPLIE ROT 10 11.4 M/L (STAPLE) IMPLANT
CLIP APPLIE ROT 13.4 12 LRG (CLIP) ×1 IMPLANT
DERMABOND ADVANCED (GAUZE/BANDAGES/DRESSINGS) ×2
DERMABOND ADVANCED .7 DNX12 (GAUZE/BANDAGES/DRESSINGS) ×1 IMPLANT
DEVICE SUT QUICK LOAD TK 5 (STAPLE) IMPLANT
DEVICE SUT TI-KNOT TK 5X26 (MISCELLANEOUS) IMPLANT
DEVICE SUTURE ENDOST 10MM (ENDOMECHANICALS) IMPLANT
DEVICE TI KNOT TK5 (MISCELLANEOUS)
DEVICE TROCAR PUNCTURE CLOSURE (ENDOMECHANICALS) IMPLANT
DISSECTOR BLUNT TIP ENDO 5MM (MISCELLANEOUS) IMPLANT
DRAPE UTILITY XL STRL (DRAPES) ×6 IMPLANT
ELECT REM PT RETURN 15FT ADLT (MISCELLANEOUS) ×3 IMPLANT
GAUZE SPONGE 4X4 12PLY STRL (GAUZE/BANDAGES/DRESSINGS) IMPLANT
GLOVE SURG SIGNA 7.5 PF LTX (GLOVE) ×3 IMPLANT
GOWN STRL REUS W/TWL XL LVL3 (GOWN DISPOSABLE) ×9 IMPLANT
HOVERMATT SINGLE USE (MISCELLANEOUS) ×3 IMPLANT
KIT BASIN OR (CUSTOM PROCEDURE TRAY) ×3 IMPLANT
MARKER SKIN DUAL TIP RULER LAB (MISCELLANEOUS) ×3 IMPLANT
NEEDLE SPNL 22GX3.5 QUINCKE BK (NEEDLE) ×3 IMPLANT
PACK UNIVERSAL I (CUSTOM PROCEDURE TRAY) ×3 IMPLANT
PENCIL BUTTON HOLSTER BLD 10FT (ELECTRODE) IMPLANT
QUICK LOAD TK 5 (STAPLE)
RELOAD STAPLER BLUE 60MM (STAPLE) IMPLANT
RELOAD STAPLER GOLD 60MM (STAPLE) ×4 IMPLANT
RELOAD STAPLER GREEN 60MM (STAPLE) ×2 IMPLANT
SCISSORS LAP 5X35 DISP (ENDOMECHANICALS) ×3 IMPLANT
SEALANT SURGICAL APPL DUAL CAN (MISCELLANEOUS) ×3 IMPLANT
SET IRRIG TUBING LAPAROSCOPIC (IRRIGATION / IRRIGATOR) ×3 IMPLANT
SHEARS HARMONIC ACE PLUS 45CM (MISCELLANEOUS) ×3 IMPLANT
SLEEVE ADV FIXATION 5X100MM (TROCAR) ×6 IMPLANT
SLEEVE GASTRECTOMY 36FR VISIGI (MISCELLANEOUS) ×3 IMPLANT
SOLUTION ANTI FOG 6CC (MISCELLANEOUS) ×3 IMPLANT
SPONGE LAP 18X18 X RAY DECT (DISPOSABLE) ×3 IMPLANT
STAPLER ECHELON LONG 60 440 (INSTRUMENTS) ×3 IMPLANT
STAPLER RELOAD BLUE 60MM (STAPLE)
STAPLER RELOAD GOLD 60MM (STAPLE) ×12
STAPLER RELOAD GREEN 60MM (STAPLE) ×6
SUT MNCRL AB 4-0 PS2 18 (SUTURE) ×3 IMPLANT
SUT SURGIDAC NAB ES-9 0 48 120 (SUTURE) IMPLANT
SUT VICRYL 0 TIES 12 18 (SUTURE) IMPLANT
SYR 10ML ECCENTRIC (SYRINGE) ×3 IMPLANT
SYR CONTROL 10ML LL (SYRINGE) ×3 IMPLANT
TOWEL OR 17X26 10 PK STRL BLUE (TOWEL DISPOSABLE) ×3 IMPLANT
TOWEL OR NON WOVEN STRL DISP B (DISPOSABLE) ×3 IMPLANT
TROCAR ADV FIXATION 12X100MM (TROCAR) ×3 IMPLANT
TROCAR ADV FIXATION 5X100MM (TROCAR) ×3 IMPLANT
TROCAR BLADELESS 15MM (ENDOMECHANICALS) ×3 IMPLANT
TROCAR BLADELESS OPT 5 100 (ENDOMECHANICALS) ×3 IMPLANT
TUBING CONNECTING 10 (TUBING) ×2 IMPLANT
TUBING CONNECTING 10' (TUBING) ×1
TUBING ENDO SMARTCAP PENTAX (MISCELLANEOUS) ×3 IMPLANT
TUBING INSUF HEATED (TUBING) ×3 IMPLANT

## 2017-02-13 NOTE — Anesthesia Procedure Notes (Signed)
Procedure Name: Intubation Date/Time: 02/13/2017 7:39 AM Performed by: Montel Clock, CRNA Pre-anesthesia Checklist: Patient identified, Emergency Drugs available, Suction available, Patient being monitored and Timeout performed Patient Re-evaluated:Patient Re-evaluated prior to induction Oxygen Delivery Method: Circle system utilized Preoxygenation: Pre-oxygenation with 100% oxygen Induction Type: IV induction Ventilation: Mask ventilation without difficulty and Oral airway inserted - appropriate to patient size Laryngoscope Size: Mac and 3 Grade View: Grade I Tube type: Oral Tube size: 7.0 mm Number of attempts: 1 Airway Equipment and Method: Stylet Placement Confirmation: ETT inserted through vocal cords under direct vision,  positive ETCO2 and breath sounds checked- equal and bilateral Secured at: 21 cm Tube secured with: Tape Dental Injury: Teeth and Oropharynx as per pre-operative assessment

## 2017-02-13 NOTE — Interval H&P Note (Signed)
History and Physical Interval Note:  02/13/2017 7:12 AM  Jamie Cooper  has presented today for surgery, with the diagnosis of MORBID OBESITY  The various methods of treatment have been discussed with the patient and family.  Her husband is here with her.  After consideration of risks, benefits and other options for treatment, the patient has consented to  Procedure(s): LAPAROSCOPIC GASTRIC SLEEVE RESECTION WITH UPPER ENDO (N/A) as a surgical intervention .  The patient's history has been reviewed, patient examined, no change in status, stable for surgery.  I have reviewed the patient's chart and labs.  Questions were answered to the patient's satisfaction.     Kandis Cockingavid H Kaidin Boehle

## 2017-02-13 NOTE — Op Note (Addendum)
PATIENT:   Jamie Cooper DOB:   October 10, 1985 MRN:   [id number]  DATE OF PROCEDURE: 02/13/2017                   FACILITY[facility]  [facility]  OPERATIVE REPORT  PREOPERATIVE DIAGNOSIS:  Morbid obesity.  POSTOPERATIVE DIAGNOSIS:  Morbid obesity (weight 241, BMI of 38.2).  PROCEDURE:  Laparoscopic Sleeve gastrectomy (intraoperative upper endoscopy by Dr. Daphine DeutscherMartin), 30 minutes of lysis of adhesions  SURGEON:  Sandria Balesavid H. Ezzard StandingNewman, MD  FIRST ASSISTANT:  Sheron NightingaleM. Martin, MD  ANESTHESIA:  General endotracheal.  Anesthesiologist: Lewie LoronGermeroth, John, MD CRNA: Epimenio SarinJarvela, Joshua R, CRNA  General  ESTIMATED BLOOD LOSS:  Minimal.  LOCAL ANESTHESIA:  30 cc 1/4% Marcaine + 20 cc Exparel  COMPLICATIONS:  None.  INDICATION FOR SURGERY:  Jamie Cooper is a 31 y.o. white female who sees Gilda CreaseNarendran, Mahendra, MD as her primary care doctor.  She has completed our preoperative bariatric program and now comes for a laparoscopic sleeve gastrectomy.   Ms. Wallace Cooper had a slipped lap band that I removed on 11/21/2016.  At the time of her lap band removal, I was worried about the thinnig of the fundus and thought it would be better to wait 3 months before doing the sleeve gastrectomy.  She now comes for the sleeve gastrectomy.   The indications, potential complications of surgery were explained to the patient.  Potential complications of the surgery include, but are not limited to, bleeding, infection, DVT, open surgery, and long-term nutritional consequences.  OPERATIVE NOTE:  The patient taken to [facility] where Ms. Wallace Cooper underwent a general endotracheal anesthetic, supervised by Anesthesiologist: Lewie LoronGermeroth, John, MD CRNA: Epimenio SarinJarvela, Joshua R, CRNA.  The patient was given 2 g of cefotetan at the beginning of the procedure.  A time-out was held and surgical checklist run.  I accessed her abdominal cavity through the left upper quadrant with a 5 mm Optiview. I did an abdominal exploration.   Her omentum and bowel were unremarkable. The right and left  lobes of the liver unremarkable. Gallbladder was normal. Her stomach was unremarkable.   I placed a total of 5 additional trocars. I placed a 5 mm left lateral trocar, a 5 mm left paramedian trocar (for the scope), a 12 mm right paramedian torcar, a 5 mm right subcostal trocar that I converted to a 15 mm to extract the stomach and 5 mm subxiphoid trocar for the liver retractor.  I placed a abdominal wall anesthetic block using a mixture of 1/4% Marcaine and Exparel.  I used 20 cc per side, for a total of 40 cc.  I spent about 30 minutes doing lysis of adhesions of the stomach to the undersurface of the left lobe of the liver.  I did this to mobilize the stomach up to the hiatus.  The fundus of the stomach, though scarred, looked better than at the end of the last operation.  I then started taking down the greater curvature attachments of the stomach. I measured approximately 6 cm proximal from the pylorus and mobilized the greater curvature of the stomach with the Harmonic Scalpel. I took this dissection cranially around the greater curvature of her stomach to the angle of His and the left crus.   After I had mobilized the greater curvature of the stomach, I then passed the 36 French ViSiGi bougie which was used to suck the stomach up against the lesser curvature and placed into the antrum. During the staple firing,  I tried to  give the ViSiGi a cuff at least about 1 cm. I tried to avoid narrowing the incisura. I used a total of 6 staple firings.  From antrum to the angle of His, I used 2 green, 4 gold and 0 blue Eschelon 60 mm Ethicon staplers. I did not use staple line reinforcement.   At each firing of the EndoGIA stapler, I inspected the stomach, anterior wall of the stomach, and underneath to make sure there was no compromise or impingement on to the ViSiGi bougie.   The staple line seemed linear without any corkscrewing of the stomach. Hemostasis was good. I did not use any reinforcement. She did  have at least 3 areas of bleeding which I used clips to clip on the new greater curvature of the stomach.  Because I thought we had a good staple line, I then had the ViSiGi was converted to insufflate the pouch. The new stomach pouch was placed under water. There was no bubbling or leak noted.   At this point, Dr. Daphine DeutscherMartin broke scrub and passed an upper endoscope down through the esophagus into the stomach pouch. The EG junction was at about 37 cm.  The stomach was tubular. There was no narrowing of the stomach pouch or angulation. We were easily able to pass the endoscope into the antrum and again put air pressure on the staple line. I irrigated the upper abdomen with saline. There was no bubbling or evidence of air leak. The mucosa looked viable. Dr. Daphine DeutscherMartin decompressed the stomach with the endoscopy.   I converted to right subcostal trocar to a 15 trocar and extracted the stomach remnant through this intact and sent this to Pathology. I then placed 10 cc of Tisseel along the new greater curvature staple line and covered the entire staple line with the Tisseel.  I aspirated out the saline that I had irrigated because I thought the staple line looked viable and complete. There was no evidence of leak. I did not leave a drain in place.   Then, I closed  the skin at each port site with a 4-0 Monocryl, painted each wound with LiquiBand.   The patient was transported to recovery room in good condition. Sponge and needle count were correct at the end of the case.    Jamie Kinavid Mataio Mele, MD, [facility] Pager: [phone] Office phone:  [phone]

## 2017-02-13 NOTE — Transfer of Care (Signed)
Immediate Anesthesia Transfer of Care Note  Patient: Jamie Cooper  Procedure(s) Performed: LAPAROSCOPIC GASTRIC SLEEVE RESECTION WITH UPPER ENDO (N/A )  Patient Location: PACU  Anesthesia Type:General  Level of Consciousness: drowsy and patient cooperative  Airway & Oxygen Therapy: Patient Spontanous Breathing and Patient connected to face mask  Post-op Assessment: Report given to RN and Post -op Vital signs reviewed and stable  Post vital signs: Reviewed and stable  Last Vitals:  Vitals:   02/13/17 0557  BP: 138/86  Pulse: 74  Resp: 16  Temp: 36.8 C  SpO2: 99%    Last Pain:  Vitals:   02/13/17 0610  TempSrc:   PainSc: 4       Patients Stated Pain Goal: 5 (02/13/17 0610)  Complications: No apparent anesthesia complications

## 2017-02-13 NOTE — Discharge Instructions (Signed)
° ° ° °GASTRIC BYPASS/SLEEVE ° Home Care Instructions ° ° These instructions are to help you care for yourself when you go home. ° °Call: If you have any problems. °• Call [phone] and ask for the surgeon on call °• If you need immediate assistance come to the ER at [place]. Tell the ER staff you are a new post-op gastric bypass or gastric sleeve patient  °Signs and symptoms to report: • Severe  vomiting or nausea °o If you cannot handle clear liquids for longer than 1 day, call your surgeon °• Abdominal pain which does not get better after taking your pain medication °• Fever greater than 100.4°  F and chills °• Heart rate over 100 beats a minute °• Trouble breathing °• Chest pain °• Redness,  swelling, drainage, or foul odor at incision (surgical) sites °• If your incisions open or pull apart °• Swelling or pain in calf (lower leg) °• Diarrhea (Loose bowel movements that happen often), frequent watery, uncontrolled bowel movements °• Constipation, (no bowel movements for 3 days) if this happens: °o Take Milk of Magnesia, 2 tablespoons by mouth, 3 times a day for 2 days if needed °o Stop taking Milk of Magnesia once you have had a bowel movement °o Call your doctor if constipation continues °Or °o Take Miralax  (instead of Milk of Magnesia) following the label instructions °o Stop taking Miralax once you have had a bowel movement °o Call your doctor if constipation continues °• Anything you think is “abnormal for you” °  °Normal side effects after surgery: • Unable to sleep at night or unable to concentrate °• Irritability °• Being tearful (crying) or depressed ° °These are common complaints, possibly related to your anesthesia, stress of surgery, and change in lifestyle, that usually go away a few weeks after surgery. If these feelings continue, call your medical doctor.  °Wound Care: You may have surgical glue, steri-strips, or staples over your incisions after surgery °• Surgical glue: Looks like clear  film over your incisions and will wear off a little at a time °• Steri-strips: Adhesive strips of tape over your incisions. You may notice a yellowish color on skin under the steri-strips. This is used to make the steri-strips stick better. Do not pull the steri-strips off - let them fall off °• Staples: Staples may be removed before you leave the hospital °o If you go home with staples, call [facility] for an appointment with your surgeon’s nurse to have staples removed 10 days after surgery, [phone] °• Showering: You may shower two (2) days after your surgery unless your surgeon tells you differently °o Wash gently around incisions with warm soapy water, rinse well, and gently pat dry °o If you have a drain (tube from your incision), you may need someone to hold this while you shower °o No tub baths until staples are removed and incisions are healed °  °Medications: • Medications should be liquid or crushed if larger than the size of a dime °• Extended release pills (medication that releases a little bit at a time through the  day) should not be crushed °• Depending on the size and number of medications you take, you may need to space (take a few throughout the day)/change the time you take your medications so that you do not over-fill your pouch (smaller stomach) °• Make sure you follow-up with you primary care physician to make medication changes needed during rapid weight loss and life -style changes °•   If you have diabetes, follow up with your doctor that orders your diabetes medication(s) within one week after surgery and check your blood sugar regularly   Do not drive while taking narcotics (pain medications)   Do not take acetaminophen (Tylenol) and Roxicet or Lortab Elixir at the same time since these pain medications contain acetaminophen   Diet:  First 2 Weeks You will see the nutritionist about two (2) weeks after your surgery. The nutritionist will increase the types of  foods you can eat if you are handling liquids well:  If you have severe vomiting or nausea and cannot handle clear liquids lasting longer than 1 day call your surgeon Protein Shake  Drink at least 2 ounces of shake 5-6 times per day  Each serving of protein shakes (usually 8-12 ounces) should have a minimum of: o 15 grams of protein o And no more than 5 grams of carbohydrate  Goal for protein each day: o Men = 80 grams per day o Women = 60 grams per day     Protein powder may be added to fluids such as non-fat milk or Lactaid milk or Soy milk (limit to 35 grams added protein powder per serving)  Hydration  Slowly increase the amount of water and other clear liquids as tolerated (See Acceptable Fluids)  Slowly increase the amount of protein shake as tolerated  Sip fluids slowly and throughout the day  May use sugar substitutes in small amounts (no more than 6-8 packets per day; i.e. Splenda)  Fluid Goal  The first goal is to drink at least 8 ounces of protein shake/drink per day (or as directed by the nutritionist); some examples of protein shakes are [employer]x Nectar, Dillard'sdkins Advantage, EAS Edge HP, and Unjury. - See handout from pre-op Bariatric Education Class: o Slowly increase the amount of protein shake you drink as tolerated o You may find it easier to slowly sip shakes throughout the day o It is important to get your proteins in first  Your fluid goal is to drink 64-100 ounces of fluid daily o It may take a few weeks to build up to this   32 oz. (or more) should be clear liquids And  32 oz. (or more) should be full liquids (see below for examples)  Liquids should not contain sugar, caffeine, or carbonation  Clear Liquids:  Water of Sugar-free flavored water (i.e. Fruit HO, Propel)  Decaffeinated coffee or tea (sugar-free)  Crystal lite, Wylers Lite, Minute Maid Lite  Sugar-free Jell-O  Bouillon or broth  Sugar-free Popsicle:    - Less than 20 calories  each; Limit 1 per day  Full Liquids:                   Protein Shakes/Drinks + 2 choices per day of other full liquids  Full liquids must be: o No More Than 12 grams of Carbs per serving o No More Than 3 grams of Fat per serving  Strained low-fat cream soup  Non-Fat milk  Fat-free Lactaid Milk  Sugar-free yogurt (Dannon Lite & Fit, Greek yogurt)       Activity and Exercise: It is important to continue walking at home. Limit your physical activity as instructed by your doctor. During this time, use these guidelines:  Do not lift anything greater than ten  (10) pounds for at least two (2) weeks  Do not go back to work or drive until Designer, industrial/productyour surgeon says you can  You may have sex when you  feel comfortable o It is VERY important for female patients to use a reliable birth control method; fertility often increase after surgery o Do not get pregnant for at least 18 months  Start exercising as soon as your doctor tells you that you can o Make sure your doctor approves any physical activity  Start with a simple walking program  Walk 5-15 minutes each day, 7 days per week  Slowly increase until you are walking 30-45 minutes per day  Consider joining our BELT program. [phone] or email [email or web address]   Special Instructions Things to remember:  Use your CPAP when sleeping if this applies to you  Consider buying a medical alert bracelet that says you had lap-band surgery     You will likely have your first fill (fluid added to your band) 6 - 8 weeks after surgery  [facility] has a free Bariatric Surgery Support Group that meets monthly, the 3rd Thursday, 6pm. [facility] Classrooms. You can see classes online at [email or web address]  It is very important to keep all follow up appointments with your surgeon, nutritionist, primary care physician, and behavioral health practitioner o After the first year, please follow up with your bariatric  surgeon and nutritionist at least once a year in order to maintain best weight loss results                    [facility]:  [phone]               [facility]: [phone]               Bariatric Nurse Coordinator: [phone]- [phone]  Gastric Bypass/Sleeve Home Care Instructions  Rev. 04/2012                                                         Reviewed and Endorsed                                                    by [facility] Patient Education Committee, Jan, 2014

## 2017-02-13 NOTE — Progress Notes (Signed)
Discussed post op day goals with patient including ambulation, IS, diet progression, pain, and nausea control.  Started water.  Questions answered.

## 2017-02-14 ENCOUNTER — Encounter (HOSPITAL_COMMUNITY): Payer: Self-pay | Admitting: Surgery

## 2017-02-14 LAB — CBC WITH DIFFERENTIAL/PLATELET
Basophils Absolute: 0 10*3/uL (ref 0.0–0.1)
Basophils Relative: 0 %
Eosinophils Absolute: 0 10*3/uL (ref 0.0–0.7)
Eosinophils Relative: 0 %
HCT: 32 % — ABNORMAL LOW (ref 36.0–46.0)
Hemoglobin: 10.1 g/dL — ABNORMAL LOW (ref 12.0–15.0)
Lymphocytes Relative: 15 %
Lymphs Abs: 1.4 10*3/uL (ref 0.7–4.0)
MCH: 24.2 pg — ABNORMAL LOW (ref 26.0–34.0)
MCHC: 31.6 g/dL (ref 30.0–36.0)
MCV: 76.7 fL — ABNORMAL LOW (ref 78.0–100.0)
Monocytes Absolute: 0.9 10*3/uL (ref 0.1–1.0)
Monocytes Relative: 9 %
Neutro Abs: 7.2 10*3/uL (ref 1.7–7.7)
Neutrophils Relative %: 76 %
Platelets: 236 10*3/uL (ref 150–400)
RBC: 4.17 MIL/uL (ref 3.87–5.11)
RDW: 16.1 % — ABNORMAL HIGH (ref 11.5–15.5)
WBC: 9.5 10*3/uL (ref 4.0–10.5)

## 2017-02-14 NOTE — Anesthesia Postprocedure Evaluation (Signed)
Anesthesia Post Note  Patient: Jamie Cooper  Procedure(s) Performed: LAPAROSCOPIC GASTRIC SLEEVE RESECTION WITH UPPER ENDO (N/A )     Patient location during evaluation: PACU Anesthesia Type: General Level of consciousness: sedated and patient cooperative Pain management: pain level controlled Vital Signs Assessment: post-procedure vital signs reviewed and stable Respiratory status: spontaneous breathing Cardiovascular status: stable Anesthetic complications: no    Last Vitals:  Vitals:   02/14/17 0551 02/14/17 1057  BP: 121/67 (!) 153/84  Pulse: (!) 53 65  Resp: 19 18  Temp: 36.4 C 37.1 C  SpO2: 100% 100%    Last Pain:  Vitals:   02/14/17 1057  TempSrc: Oral  PainSc:                  Jamie Cooper

## 2017-02-14 NOTE — Discharge Summary (Signed)
Physician Discharge Summary  Patient ID:  Jamie Cooper  MRN: [id number]  DOB/AGE: 530-Apr-1987 31 y.o.  Admit date: 02/13/2017 Discharge date: 02/14/2017  Discharge Diagnoses:  1.   MORBID OBESITY (E66.01)             Weight - 251, BMI - 38.2  2.  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 [place]. [place] - evidence of a slip in the lap band, so it was removed             Lap band removed - 11/21/2016 - D. Kennedy Bohanon 3.  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 Sharlee BlewLargure, anesthesiology back ground, [name] is his PA.        [place] [facility] [place] 4. Lap appendectomy and cyst on intestines - 2007 - Dr. Roxan Hockeyobinson             Lap chole - 2014 - Dr. Roxan Hockeyobinson 5. History of kidney stones   Active Problems:   Morbid obesity (HCC)  Operation: Procedure(s): LAPAROSCOPIC GASTRIC SLEEVE RESECTION WITH UPPER ENDO on 02/13/2017 - D. Ezzard StandingNewman  Discharged Condition: good  Hospital Course: [name] is an 31 y.o. female whose primary care physician is Gilda CreaseNarendran, Mahendra, MD and who was admitted 02/13/2017 with a chief complaint of morbid obesity and failed lap band.   She was brought to the operating room on 02/13/2017 and underwent  LAPAROSCOPIC GASTRIC SLEEVE RESECTION WITH UPPER ENDO.  She had a lot of nausea yesterday, but she is better today. She'll advance to her protein drinks and, if tolerated, will go home today.   The discharge instructions were reviewed with the patient.  Consults: None  Significant Diagnostic Studies: Results for orders placed or performed during the hospital encounter of 02/13/17  Pregnancy, urine STAT morning of surgery  Result Value Ref Range   Preg Test, Ur NEGATIVE NEGATIVE  Hemoglobin and hematocrit, blood  Result Value Ref Range   Hemoglobin 10.9 (L) 12.0 - 15.0 g/dL   HCT 40.935.2 (L) 81.136.0 - 91.446.0 %  Creatinine, serum   Result Value Ref Range   Creatinine, Ser 0.68 0.44 - 1.00 mg/dL   GFR calc non Af Amer >60 >60 mL/min   GFR calc Af Amer >60 >60 mL/min  CBC WITH DIFFERENTIAL  Result Value Ref Range   WBC 9.5 4.0 - 10.5 K/uL   RBC 4.17 3.87 - 5.11 MIL/uL   Hemoglobin 10.1 (L) 12.0 - 15.0 g/dL   HCT 78.232.0 (L) 95.636.0 - 21.346.0 %   MCV 76.7 (L) 78.0 - 100.0 fL   MCH 24.2 (L) 26.0 - 34.0 pg   MCHC 31.6 30.0 - 36.0 g/dL   RDW 08.616.1 (H) 57.811.5 - 46.915.5 %   Platelets 236 150 - 400 K/uL   Neutrophils Relative % 76 %   Neutro Abs 7.2 1.7 - 7.7 K/uL   Lymphocytes Relative 15 %   Lymphs Abs 1.4 0.7 - 4.0 K/uL   Monocytes Relative 9 %   Monocytes Absolute 0.9 0.1 - 1.0 K/uL   Eosinophils Relative 0 %   Eosinophils Absolute 0.0 0.0 - 0.7 K/uL   Basophils Relative 0 %   Basophils Absolute 0.0 0.0 - 0.1 K/uL    No results found.  Discharge Exam:  Vitals:   02/14/17  0150 02/14/17 0551  BP: 120/68 121/67  Pulse: 67 (!) 53  Resp: 16 19  Temp: 98 F (36.7 C) 97.6 F (36.4 C)  SpO2: 99% 100%    General: Obese WF who is alert and generally healthy appearing.  Lungs: Clear to auscultation and symmetric breath sounds. Heart:  RRR. No murmur or rub. Abdomen: Soft. No mass. No hernia. Few bowel sounds.     Incisions okay.   Discharge Medications:   Allergies as of 02/14/2017      Reactions   Lyrica [pregabalin] Swelling   Arms and legs      Medication List    TAKE these medications   citalopram 20 MG tablet Commonly known as:  CELEXA Take 20 mg by mouth at bedtime.   methocarbamol 500 MG tablet Commonly known as:  ROBAXIN Take 500 mg by mouth 2 (two) times daily.   MULTIVITAMIN GUMMIES ADULT PO Take 1 each by mouth daily.   SUMAtriptan 100 MG tablet Commonly known as:  IMITREX Take 100 mg by mouth once. At onset of migraine - may repeat in 2 hours if needed - no more than 2 per 24 hours   topiramate 25 MG capsule Commonly known as:  TOPAMAX Take 50 mg by mouth at bedtime.   traMADol 50 MG  tablet Commonly known as:  ULTRAM Take 50 mg by mouth every 12 (twelve) hours as needed for moderate pain.   Vitamin D (Ergocalciferol) 50000 units Caps capsule Commonly known as:  DRISDOL Take 50,000 Units by mouth every 7 (seven) days. Tuesdays       Disposition: 01-Home or Self Care  Discharge Instructions    Ambulate hourly while awake   Complete by:  As directed    Call MD for:  difficulty breathing, headache or visual disturbances   Complete by:  As directed    Call MD for:  persistant dizziness or light-headedness   Complete by:  As directed    Call MD for:  persistant nausea and vomiting   Complete by:  As directed    Call MD for:  redness, tenderness, or signs of infection (pain, swelling, redness, odor or green/yellow discharge around incision site)   Complete by:  As directed    Call MD for:  severe uncontrolled pain   Complete by:  As directed    Call MD for:  temperature >101 F   Complete by:  As directed    Diet bariatric full liquid   Complete by:  As directed    Incentive spirometry   Complete by:  As directed    Perform hourly while awake      Follow-up Information    Ovidio KinNewman, Nishan Ovens, MD. Go on 02/28/2017.   Specialty:  General Surgery Why:  at [address] Contact information: [address] [place] [address] [phone]        Ovidio KinNewman, Alton Tremblay, MD Follow up.   Specialty:  General Surgery Contact information: [address] [place] [address] [phone]            Signed: Ovidio Kinavid Sabir Charters, M.D., [facility] Office:  [phone]  02/14/2017, 7:24 AM

## 2017-02-14 NOTE — Progress Notes (Signed)
Patient alert and oriented, pain is controlled. Patient is tolerating fluids, advanced to protein shake today, patient is tolerating well.  Reviewed Gastric sleeve discharge instructions with patient and patient is able to articulate understanding.  Provided information on BELT program, Support Group and [facility] outpatient pharmacy. All questions answered, will continue to monitor.  

## 2017-02-14 NOTE — Plan of Care (Signed)

## 2017-02-14 NOTE — Progress Notes (Signed)
Patient alert and oriented, Post op day 1.  Provided support and encouragement.  Encouraged pulmonary toilet, ambulation and small sips of liquids. Completed 12 ounces of clear fluids and started protein shakes without difficulty. All questions answered.  Will continue to monitor.

## 2017-02-16 ENCOUNTER — Telehealth (HOSPITAL_COMMUNITY): Payer: Self-pay

## 2017-02-16 NOTE — Telephone Encounter (Signed)
Follow up with bariatric surgical patient to discuss post discharge questions.    1.  Are you having any pain not relieved by pain medication?No taking tylenol  2.  How much fluid total fluid intake have you had in the last 24/48 hours?  51 ounces  3.  How much protein intake have you had in the last 24/48 hours?30 grams or 1 shake  4.  Have you had any trouble making urine?no  5.  Have you had nausea that has not been relieved by nausea medication?taken nausea medication and it helped  6.  Are you ambulating every hour?yes  7.  Are you passing gas or had a BM?passing gas no bm encouraged to take miralax  8.  Do you know how to contact [facility]? [facility]? [facility]?yes  9.  Are you taking your vitamins and calcium without difficulty?no problems just taste bad 10. Tell me how your incision looks?  Any redness, open incision, or drainage?they look good

## 2017-02-27 ENCOUNTER — Ambulatory Visit: Payer: BLUE CROSS/BLUE SHIELD

## 2017-02-28 ENCOUNTER — Encounter: Payer: BLUE CROSS/BLUE SHIELD | Attending: Surgery | Admitting: Skilled Nursing Facility1

## 2017-02-28 ENCOUNTER — Encounter: Payer: Self-pay | Admitting: Skilled Nursing Facility1

## 2017-02-28 DIAGNOSIS — Z6841 Body Mass Index (BMI) 40.0 and over, adult: Secondary | ICD-10-CM | POA: Diagnosis not present

## 2017-02-28 DIAGNOSIS — Z713 Dietary counseling and surveillance: Secondary | ICD-10-CM | POA: Diagnosis not present

## 2017-02-28 DIAGNOSIS — E669 Obesity, unspecified: Secondary | ICD-10-CM | POA: Insufficient documentation

## 2017-02-28 NOTE — Progress Notes (Signed)
Bariatric Class:  Appt start time: 1530 end time:  1630.  2 Week Post-Operative Nutrition Class  Patient was seen on 02/28/2017 for Post-Operative Nutrition education at the [facility].   Pt states she has been struggling with nausea. Pt states she got nauseous with skim cows milk and the multivitamin. States she was nervous to come to her health care appts because she was worried she was not doing things right and failing. Pt states her hair was falling out before suregy but now it is better. Pt states she has had egg salad last week. Pt states she cannot stand yogurt. Pt states she is trying to figure out when her stomach is hungery or it's head hunger. Pt states the only reason she got the surgery was for her Back pain.  Drinking almond coconut milk. Pt states she gets nauseous with her multivitamin.  Pt was advised to start working with a mental health professional but because she lives 60 minutes away a list was not provided, pt states she will look for someone. Pt also states her cousin is a Firefighter so she might start seeing her.   Drinking powered zero: 32 oz and 60 grams of protein and sometimes some broth and chicken and stars soup and jello and sugar free popcicles  Surgery date: 02/13/2017 Surgery type: Sleeve Start weight at [facility]: Has not been here for a weight before surgery  Weight today: 242.8  TANITA  BODY COMP RESULTS  02/28/2017   BMI (kg/m^2) 40.4   Fat Mass (lbs) 122.2   Fat Free Mass (lbs) 120.6   Total Body Water (lbs) 89   The following the learning objectives were met by the patient during this course:  Identifies Phase 3A (Soft, High Proteins) Dietary Goals and will begin from 2 weeks post-operatively to 2 months post-operatively  Identifies appropriate sources of fluids and proteins   States protein recommendations and appropriate sources post-operatively  Identifies the need for appropriate texture modifications,  mastication, and bite sizes when consuming solids  Identifies appropriate multivitamin and calcium sources post-operatively  Describes the need for physical activity post-operatively and will follow MD recommendations  States when to call healthcare provider regarding medication questions or post-operative complications  Handouts given during class include:  Phase 3A: Soft, High Protein Diet Handout  Follow-Up Plan: Patient will follow-up at [facility] in 6 weeks for 2 month post-op nutrition visit for diet advancement per MD.

## 2017-04-11 ENCOUNTER — Ambulatory Visit: Payer: BLUE CROSS/BLUE SHIELD | Admitting: Skilled Nursing Facility1

## 2018-08-02 IMAGING — RF DG UGI W/ KUB
6 series · 14 of 19 positions shown · non-contrast
Comparison: 09/08/2016

ADDENDUM:
As described in the report below, the lap band is effaced. In
addition, there is slightly more stomach above the lap band than
normal. This is consistent with a mild slippage of the lap band.
This was discussed with Awzal. John-Paul by telephone at the time this
addendum.
CLINICAL DATA: History of lap band procedure. Patient for sleeve
gastrectomy.

EXAM:
UPPER GI SERIES WITH KUB
TECHNIQUE: After obtaining a scout radiograph a routine upper GI series was
performed using thin barium
FLUOROSCOPY TIME:  Fluoroscopy Time:  1 minutes and 48 seconds.
Radiation Exposure Index (if provided by the fluoroscopic device):
[id number]
Number of Acquired Spot Images: 0

[Series 1: one shot · 1 of 2 slices shown (1 of 2)]
[im 1/2]
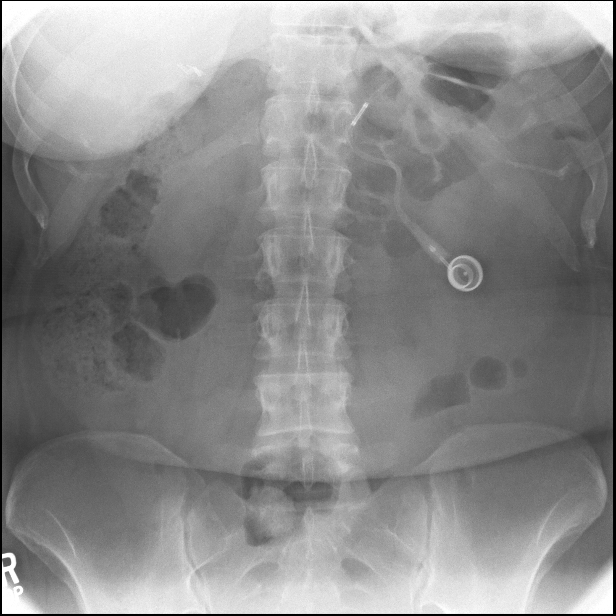

[Series 2: sequence · 3 of 30 frames shown (1 of 4)]
[frame 5/30]
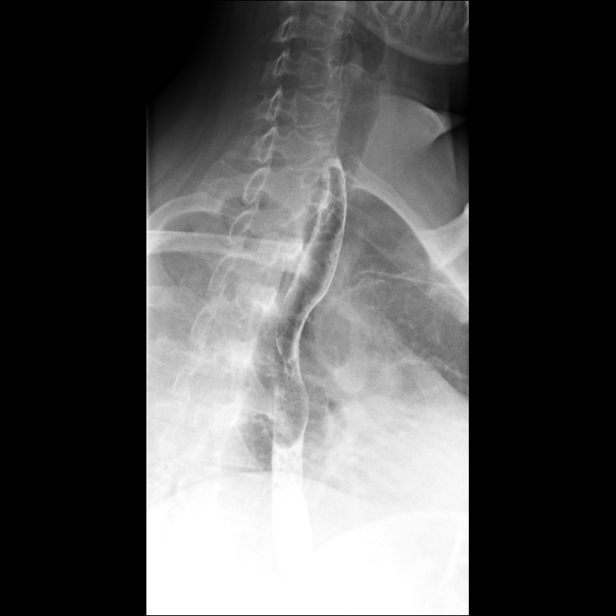
[frame 16/30]
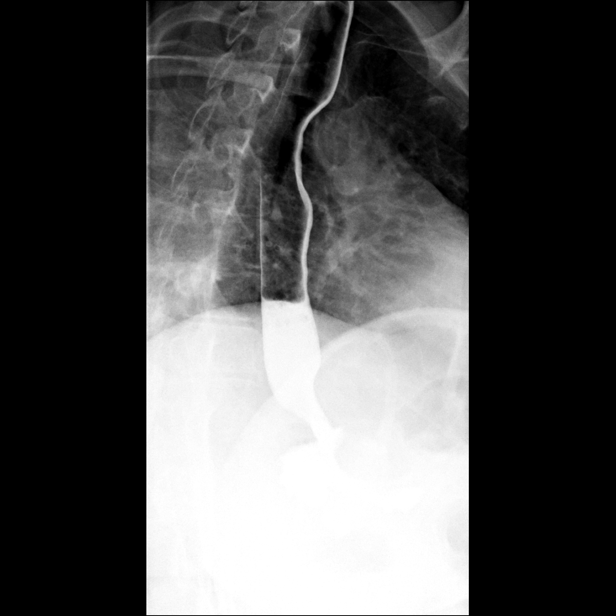
[frame 26/30]
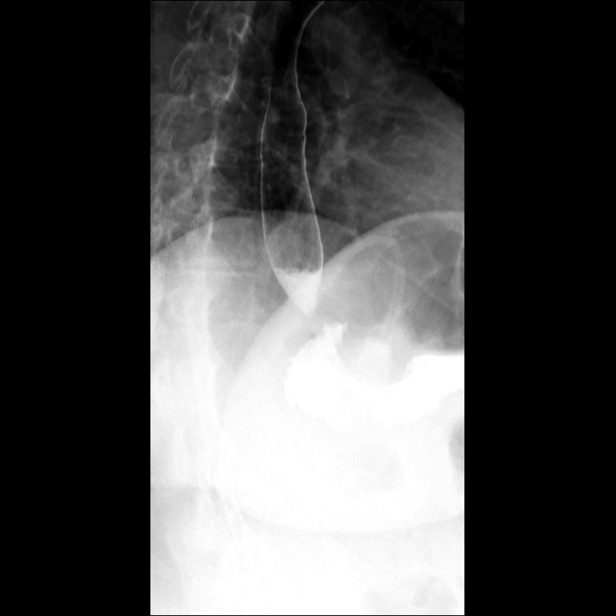

[Series 3: sequence · 2 of 2 frames shown (2 of 4)]
[frame 1/2]
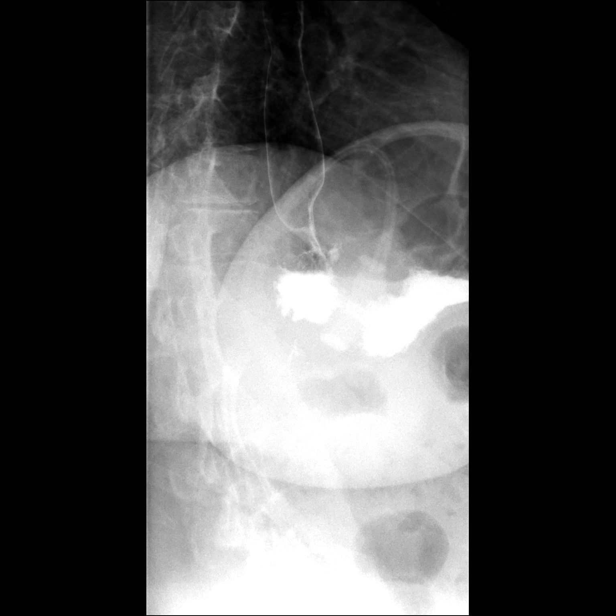
[frame 2/2]
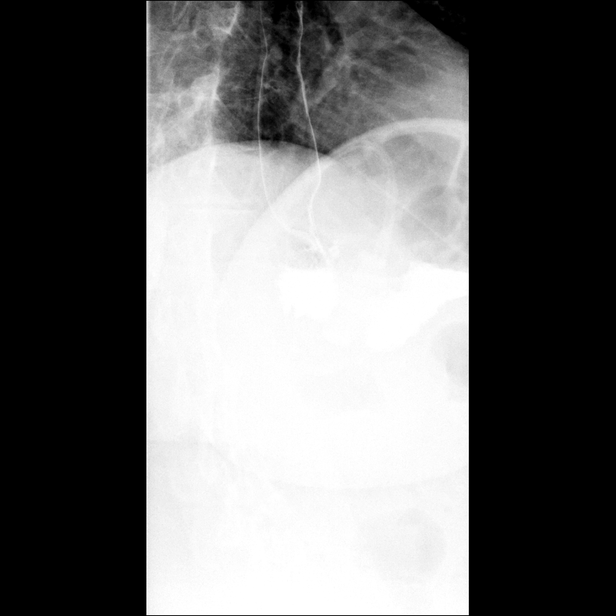

[Series 4: sequence · 1 of 1 slices shown (3 of 4)]
[im 1/1]
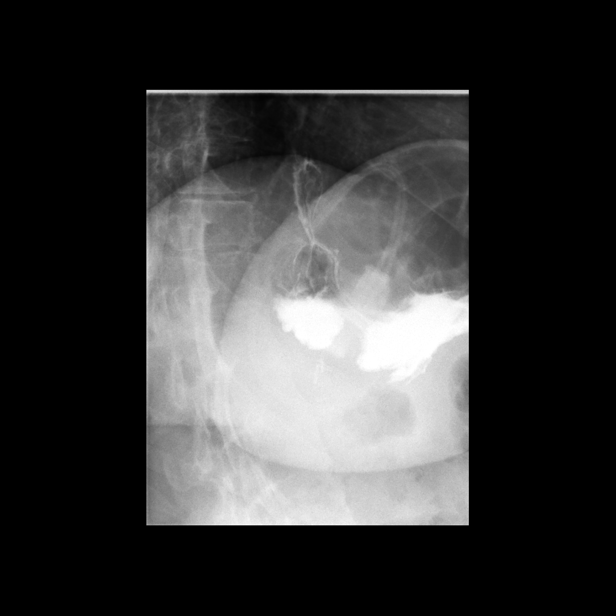

[Series 5: sequence · 2 of 10 frames shown (4 of 4)]
[frame 6/10]
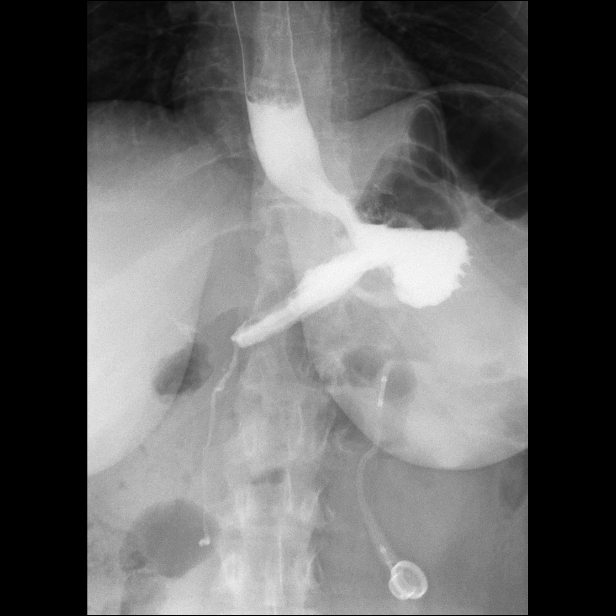
[frame 9/10]
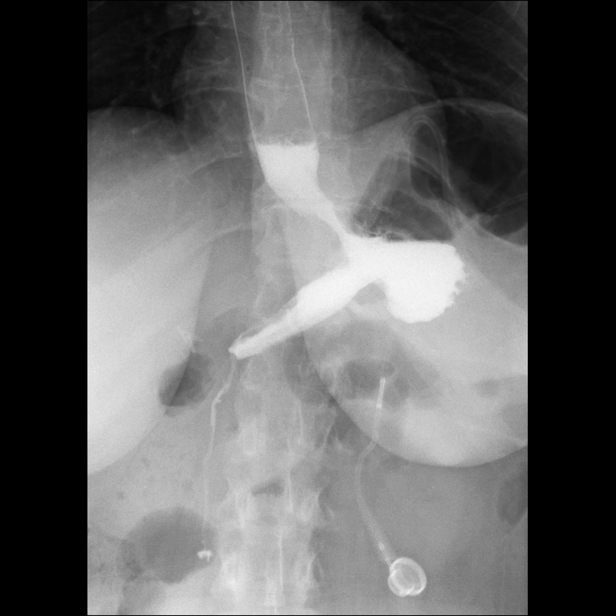

[Series 6: one shot · 5 of 7 slices shown (2 of 2)]
[im 1/7]
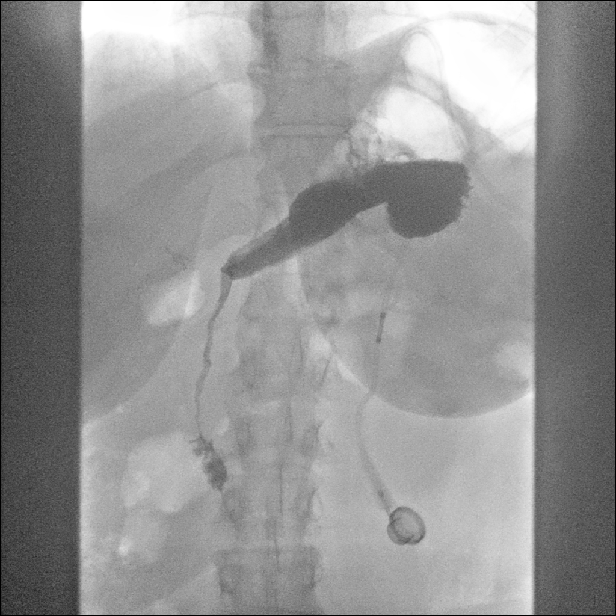
[im 3/7]
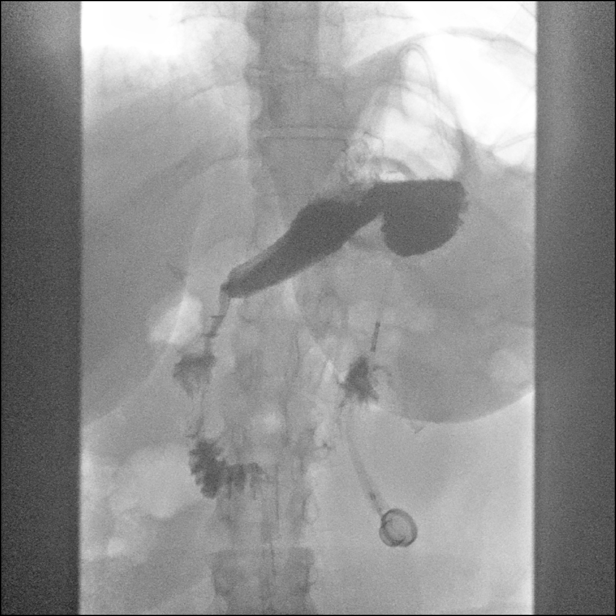
[im 4/7]
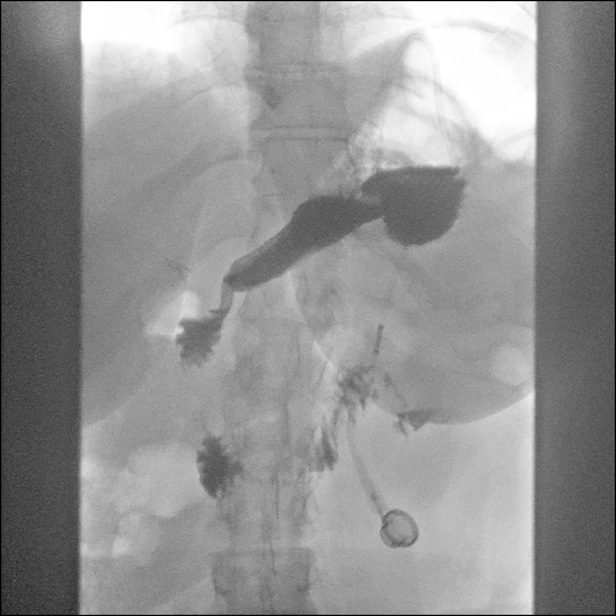
[im 5/7]
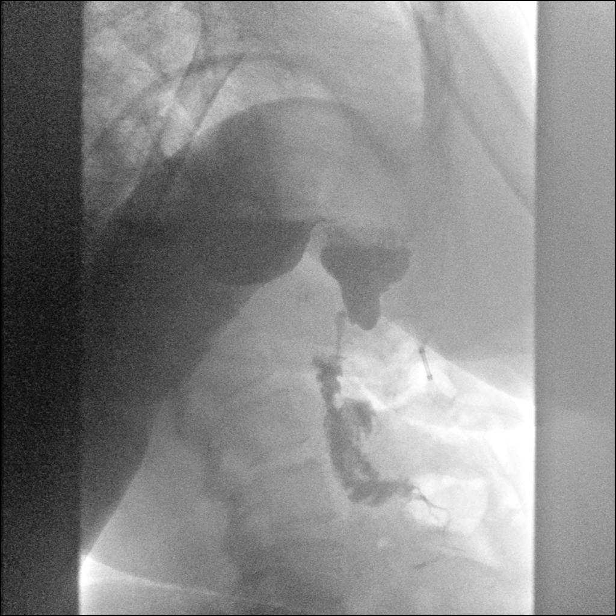
[im 7/7]
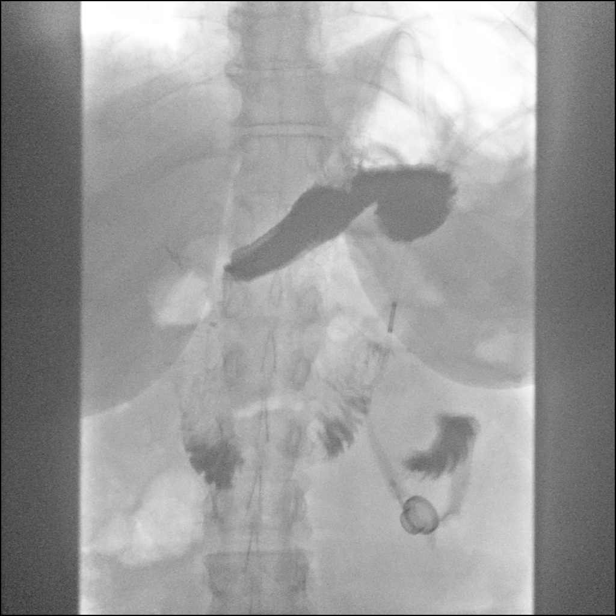

[14 of 19 positions shown; findings below may reference images not displayed]

FINDINGS: Preprocedure scout film again shows rotation of the lap band with
long axis orientation from [DATE] to [DATE]. Bowel gas pattern is
normal.

Oblique imaging of the esophagus while swallowing is normal, without
evidence for stricture, diverticulum, gross mucosal ulceration, or
mass-effect.

Contrast material flows readily through the lap band into the distal
stomach. Gastric emptying is prompt through a normal appearing
pylorus. The nondilated duodenum is in the expected anatomic
location. Ligament of Treitz is normally positioned.
IMPRESSION: 1. Stable rotation of the lap band.
2. Otherwise unremarkable single contrast upper GI series.

## 2018-10-23 ENCOUNTER — Encounter (HOSPITAL_COMMUNITY): Payer: Self-pay

## 2019-09-09 ENCOUNTER — Encounter (HOSPITAL_COMMUNITY): Payer: Self-pay

## 2020-09-10 ENCOUNTER — Encounter (HOSPITAL_COMMUNITY): Payer: Self-pay | Admitting: *Deleted
